# Patient Record
Sex: Male | Born: 1985 | Race: Asian | Hispanic: No | Marital: Single | Smoking: Current every day smoker
Health system: Southern US, Community
[De-identification: ages and names within clinical notes are randomized; demographics above are authoritative.]

## PROBLEM LIST (undated history)

## (undated) DIAGNOSIS — J302 Other seasonal allergic rhinitis: Secondary | ICD-10-CM

---
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
## 2004-03-26 ENCOUNTER — Emergency Department (HOSPITAL_COMMUNITY): Admission: EM | Admit: 2004-03-26 | Discharge: 2004-03-26 | Payer: Self-pay | Admitting: Emergency Medicine

## 2005-01-24 ENCOUNTER — Emergency Department (HOSPITAL_COMMUNITY): Admission: EM | Admit: 2005-01-24 | Discharge: 2005-01-24 | Payer: Self-pay | Admitting: Emergency Medicine

## 2007-02-15 ENCOUNTER — Emergency Department (HOSPITAL_COMMUNITY): Admission: EM | Admit: 2007-02-15 | Discharge: 2007-02-15 | Payer: Self-pay | Admitting: Emergency Medicine

## 2008-04-15 ENCOUNTER — Emergency Department (HOSPITAL_COMMUNITY): Admission: EM | Admit: 2008-04-15 | Discharge: 2008-04-15 | Payer: Self-pay | Admitting: Emergency Medicine

## 2008-09-08 ENCOUNTER — Emergency Department (HOSPITAL_COMMUNITY): Admission: EM | Admit: 2008-09-08 | Discharge: 2008-09-08 | Payer: Self-pay | Admitting: Emergency Medicine

## 2009-11-30 ENCOUNTER — Emergency Department (HOSPITAL_COMMUNITY): Admission: EM | Admit: 2009-11-30 | Discharge: 2009-11-30 | Payer: Self-pay | Admitting: Emergency Medicine

## 2010-07-24 LAB — URINALYSIS, ROUTINE W REFLEX MICROSCOPIC
Hgb urine dipstick: NEGATIVE
Protein, ur: 30 mg/dL — AB
Urobilinogen, UA: 0.2 mg/dL (ref 0.0–1.0)

## 2010-07-24 LAB — CBC
Hemoglobin: 14.4 g/dL (ref 13.0–17.0)
MCHC: 32.1 g/dL (ref 30.0–36.0)
RDW: 15.4 % (ref 11.5–15.5)

## 2010-07-24 LAB — URINE CULTURE: Culture: NO GROWTH

## 2010-07-24 LAB — URINE MICROSCOPIC-ADD ON

## 2010-07-24 LAB — POCT I-STAT, CHEM 8
Chloride: 110 mEq/L (ref 96–112)
HCT: 48 % (ref 39.0–52.0)
Potassium: 3.5 mEq/L (ref 3.5–5.1)
Sodium: 142 mEq/L (ref 135–145)

## 2010-07-24 LAB — DIFFERENTIAL
Basophils Absolute: 0 10*3/uL (ref 0.0–0.1)
Basophils Relative: 0 % (ref 0–1)
Neutro Abs: 8.6 10*3/uL — ABNORMAL HIGH (ref 1.7–7.7)
Neutrophils Relative %: 79 % — ABNORMAL HIGH (ref 43–77)

## 2011-02-11 LAB — URINE MICROSCOPIC-ADD ON

## 2011-02-11 LAB — URINALYSIS, ROUTINE W REFLEX MICROSCOPIC
Nitrite: NEGATIVE
Specific Gravity, Urine: 1.027 (ref 1.005–1.030)
pH: 6 (ref 5.0–8.0)

## 2011-02-17 LAB — URINE CULTURE: Colony Count: NO GROWTH

## 2011-02-17 LAB — URINE MICROSCOPIC-ADD ON

## 2011-02-17 LAB — URINALYSIS, ROUTINE W REFLEX MICROSCOPIC
Bilirubin Urine: NEGATIVE
Nitrite: NEGATIVE
Specific Gravity, Urine: 1.023
Urobilinogen, UA: 0.2

## 2011-07-27 ENCOUNTER — Encounter (HOSPITAL_COMMUNITY): Payer: Self-pay | Admitting: Emergency Medicine

## 2011-07-27 ENCOUNTER — Emergency Department (HOSPITAL_COMMUNITY)
Admission: EM | Admit: 2011-07-27 | Discharge: 2011-07-27 | Disposition: A | Discharge status: Home / Self Care | Payer: Self-pay | Attending: Emergency Medicine | Admitting: Emergency Medicine

## 2011-07-27 DIAGNOSIS — F17200 Nicotine dependence, unspecified, uncomplicated: Secondary | ICD-10-CM | POA: Insufficient documentation

## 2011-07-27 DIAGNOSIS — W57XXXA Bitten or stung by nonvenomous insect and other nonvenomous arthropods, initial encounter: Secondary | ICD-10-CM | POA: Insufficient documentation

## 2011-07-27 DIAGNOSIS — Y9229 Other specified public building as the place of occurrence of the external cause: Secondary | ICD-10-CM | POA: Insufficient documentation

## 2011-07-27 DIAGNOSIS — T148 Other injury of unspecified body region: Secondary | ICD-10-CM | POA: Insufficient documentation

## 2011-07-27 NOTE — Discharge Instructions (Signed)
Bedbugs  Take Benadryl as needed for itching Bedbugs are tiny bugs that live in and around beds. They come out at night and bite people lying in bed. Bedbug bites rarely cause a medical problem. The bites do cause red, itchy bumps. HOME CARE  Only take medicine as told by your doctor.   Wear pajamas with long sleeves and pant legs.   Call a pest control expert. You may need to throw away your mattress. Ask the pest control expert what you can do to keep the bedbugs from coming back. You may need to:   Put a plastic cover over your mattress.   Wash your clothes and bedding in hot water. Dry them in a hot dryer. The temperature should be hotter than 120 F (48.9 C).   Vacuum all around your bed often.   Check all used furniture, bedding, or clothes for bedbugs before you bring them into your house.   Remove bird nests and bat perches around your home.   After you travel, check your clothes and luggage for bedbugs before you bring them into your house. If you find any bedbugs, throw those items away.  GET HELP RIGHT AWAY IF:  You have a fever.   You have red bug bites that keep coming back.   You have red bug bites that itch badly.   You have bug bites that cause a skin rash.   You have scratch marks that are red and sore.  MAKE SURE YOU:  Understand these instructions.   Will watch your condition.   Will get help right away if you are not doing well or get worse.  Document Released: 08/10/2010 Document Revised: 04/14/2011 Document Reviewed: 08/10/2010 [facility] Patient Information 2012 [place], [place].

## 2011-07-27 NOTE — ED Notes (Signed)
Complaining of insect bites. States he was working out of town. Several bites located on torso

## 2011-07-27 NOTE — ED Provider Notes (Signed)
History     CSN: [id number]  Arrival date & time 07/27/11  1905   First MD Initiated Contact with Patient 07/27/11 2226      Chief Complaint  Patient presents with  . Insect Bite    (Consider location/radiation/quality/duration/timing/severity/associated sxs/prior treatment) HPI Comments: Patient reports that over the past 3 days he has been staying in a motel.  This morning when he woke up he noticed several insect bites on his chest, abdomen, and back.  He sleeps without a shirt on.  He did not feel an insect bite him or see any insects.  He reports that the bites do itch, but are not painful.  He has not had any other associated symptoms.  He has not taken any medication for the symptoms.  The history is provided by the patient.    History reviewed. No pertinent past medical history.  History reviewed. No pertinent past surgical history.  No family history on file.  History  Substance Use Topics  . Smoking status: Current Everyday Smoker  . Smokeless tobacco: Not on file  . Alcohol Use: Yes      Review of Systems  Constitutional: Negative for fever and chills.  Respiratory: Negative for shortness of breath.   Gastrointestinal: Negative for nausea and vomiting.  Musculoskeletal: Negative for joint swelling.  Skin: Positive for rash.    Allergies  Codeine  Home Medications   Current Outpatient Rx  Name Route Sig Dispense Refill  . IBUPROFEN 200 MG PO TABS Oral Take 400 mg by mouth every 6 (six) hours as needed. For pain      BP 120/64  Pulse 105  Temp(Src) 98.1 F (36.7 C) (Oral)  Resp 18  SpO2 100%  Physical Exam  Nursing note and vitals reviewed. Constitutional: He appears well-developed and well-nourished. No distress.  HENT:  Head: Normocephalic and atraumatic.  Neck: Normal range of motion. Neck supple.  Cardiovascular: Normal rate, regular rhythm and normal heart sounds.   Pulmonary/Chest: Effort normal and breath sounds normal. No respiratory  distress. He has no wheezes.  Neurological: He is alert.  Skin: No petechiae and no purpura noted. Rash is not urticarial. He is not diaphoretic.       Several erythematous welts on the abdomen, chest, and back.  Areas pruiritic.    Psychiatric: He has a normal mood and affect.    ED Course  Procedures (including critical care time)  Labs Reviewed - No data to display No results found.   1. Bed bug bite       MDM  History and appearance of rash consistent with bed bug bites.  Patient instructed to take Benadryl for itching.  Return precautions discussed with patient.        Pascal Lux [place], PA-C 07/28/11 [phone]

## 2011-07-27 NOTE — ED Notes (Signed)
PT. REPORTS 3 INSECT BITES AT LEFT ABDOMEN AND NECK , ITCHY WITH REDDNESS, NO DRAINAGE.

## 2011-07-30 NOTE — ED Provider Notes (Signed)
Medical screening examination/treatment/procedure(s) were performed by non-physician practitioner and as supervising physician I was immediately available for consultation/collaboration. Devoria Albe, MD, Armando Gang   Ward Givens, MD 07/30/11 2352

## 2012-11-14 ENCOUNTER — Emergency Department (HOSPITAL_COMMUNITY)
Admission: EM | Admit: 2012-11-14 | Discharge: 2012-11-14 | Disposition: A | Discharge status: Home / Self Care | Payer: Self-pay | Attending: Emergency Medicine | Admitting: Emergency Medicine

## 2012-11-14 ENCOUNTER — Encounter (HOSPITAL_COMMUNITY): Payer: Self-pay

## 2012-11-14 DIAGNOSIS — R221 Localized swelling, mass and lump, neck: Secondary | ICD-10-CM | POA: Insufficient documentation

## 2012-11-14 DIAGNOSIS — L259 Unspecified contact dermatitis, unspecified cause: Secondary | ICD-10-CM | POA: Insufficient documentation

## 2012-11-14 DIAGNOSIS — F17200 Nicotine dependence, unspecified, uncomplicated: Secondary | ICD-10-CM | POA: Insufficient documentation

## 2012-11-14 DIAGNOSIS — R220 Localized swelling, mass and lump, head: Secondary | ICD-10-CM | POA: Insufficient documentation

## 2012-11-14 MED ORDER — METHYLPREDNISOLONE SODIUM SUCC 125 MG IJ SOLR
125.0000 mg | Freq: Once | INTRAMUSCULAR | Status: DC
  Filled 2012-11-14: qty 2

## 2012-11-14 MED ORDER — METHYLPREDNISOLONE SODIUM SUCC 125 MG IJ SOLR
125.0000 mg | Freq: Once | INTRAMUSCULAR | Status: AC
  Administered 2012-11-14: 125 mg via INTRAMUSCULAR

## 2012-11-14 MED ORDER — HYDROXYZINE HCL 25 MG PO TABS: 25.0000 mg | ORAL_TABLET | Freq: Four times a day (QID) | ORAL | Status: DC
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
## 2012-11-14 NOTE — ED Notes (Signed)
No answer in lobby.
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
## 2012-11-17 NOTE — ED Provider Notes (Signed)
Medical screening examination/treatment/procedure(s) were performed by non-physician practitioner and as supervising physician I was immediately available for consultation/collaboration.   Kynzleigh Bandel W Deforest Maiden, MD 11/17/12 1527 

## 2012-11-20 ENCOUNTER — Encounter (HOSPITAL_COMMUNITY): Payer: Self-pay | Admitting: Emergency Medicine

## 2012-11-20 ENCOUNTER — Emergency Department (HOSPITAL_COMMUNITY)
Admission: EM | Admit: 2012-11-20 | Discharge: 2012-11-21 | Disposition: A | Discharge status: Home / Self Care | Payer: Self-pay | Attending: Emergency Medicine | Admitting: Emergency Medicine

## 2012-11-20 DIAGNOSIS — F17200 Nicotine dependence, unspecified, uncomplicated: Secondary | ICD-10-CM | POA: Insufficient documentation

## 2012-11-20 DIAGNOSIS — IMO0002 Reserved for concepts with insufficient information to code with codable children: Secondary | ICD-10-CM | POA: Insufficient documentation

## 2012-11-20 DIAGNOSIS — F39 Unspecified mood [affective] disorder: Secondary | ICD-10-CM | POA: Insufficient documentation

## 2012-11-20 DIAGNOSIS — F4323 Adjustment disorder with mixed anxiety and depressed mood: Secondary | ICD-10-CM | POA: Insufficient documentation

## 2012-11-20 DIAGNOSIS — R45851 Suicidal ideations: Secondary | ICD-10-CM | POA: Insufficient documentation

## 2012-11-20 DIAGNOSIS — F4320 Adjustment disorder, unspecified: Secondary | ICD-10-CM

## 2012-11-20 LAB — RAPID URINE DRUG SCREEN, HOSP PERFORMED
Amphetamines: NOT DETECTED
Barbiturates: NOT DETECTED
Tetrahydrocannabinol: NOT DETECTED

## 2012-11-20 LAB — CBC
HCT: 45.6 % (ref 39.0–52.0)
Platelets: 247 10*3/uL (ref 150–400)
RBC: 6.47 MIL/uL — ABNORMAL HIGH (ref 4.22–5.81)
RDW: 15.4 % (ref 11.5–15.5)
WBC: 11.2 10*3/uL — ABNORMAL HIGH (ref 4.0–10.5)

## 2012-11-20 LAB — COMPREHENSIVE METABOLIC PANEL
ALT: 31 U/L (ref 0–53)
AST: 16 U/L (ref 0–37)
Albumin: 3.8 g/dL (ref 3.5–5.2)
Alkaline Phosphatase: 91 U/L (ref 39–117)
Chloride: 106 mEq/L (ref 96–112)
Potassium: 4.1 mEq/L (ref 3.5–5.1)
Total Bilirubin: 0.2 mg/dL — ABNORMAL LOW (ref 0.3–1.2)

## 2012-11-20 MED ORDER — ZIPRASIDONE MESYLATE 20 MG IM SOLR
20.0000 mg | Freq: Once | INTRAMUSCULAR | Status: AC | PRN
  Filled 2012-11-20: qty 20

## 2012-11-20 NOTE — ED Provider Notes (Signed)
History    CSN: [id number] Arrival date & time 11/20/12  2115  First MD Initiated Contact with Patient 11/20/12 2123     Chief Complaint  Patient presents with  . Medical Clearance   HPI  History provided by the patient and [employer]. The patient is a 27 year old male with no significant PMH who presents in custody of police for expressed S. and HI earlier. Rations states that he was in an argument with his girlfriend and cannot take anymore. He was very upset and made verbal threats of killing her as well as stating that you plan to drive off a bridge. He called the police himself and stated he was going to do something bad. Please came in he was placed in custody. They are currently in the process of taking out IVC paperwork. Patient does admit to previous history of depression and cutting himself 6 months ago. He is not currently on any medications. He denies any other symptoms. No hallucinations. No drug or alcohol use.    History reviewed. No pertinent past medical history. History reviewed. No pertinent past surgical history. History reviewed. No pertinent family history. History  Substance Use Topics  . Smoking status: Current Every Day Smoker  . Smokeless tobacco: Not on file  . Alcohol Use: Yes    Review of Systems  Psychiatric/Behavioral: Positive for suicidal ideas, dysphoric mood and agitation. Negative for hallucinations and self-injury.  All other systems reviewed and are negative.    Allergies  Codeine  Home Medications   Current Outpatient Rx  Name  Route  Sig  Dispense  Refill  . diphenhydrAMINE (SOMINEX) 25 MG tablet   Oral   Take 25 mg by mouth at bedtime as needed for itching or sleep.         . hydrOXYzine (ATARAX/VISTARIL) 25 MG tablet   Oral   Take 1 tablet (25 mg total) by mouth every 6 (six) hours.   15 tablet   0    BP 139/79  Pulse 106  Temp(Src) 97.5 F (36.4 C) (Oral)  Resp 20  SpO2 98% Physical Exam  Nursing note and vitals  reviewed. Constitutional: He is oriented to person, place, and time. He appears well-developed and well-nourished. No distress.  HENT:  Head: Normocephalic.  Cardiovascular: Normal rate and regular rhythm.   No murmur heard. Pulmonary/Chest: Effort normal and breath sounds normal. No respiratory distress. He has no wheezes. He has no rales.  Abdominal: Soft.  Musculoskeletal: Normal range of motion.  Neurological: He is alert and oriented to person, place, and time.  Skin: Skin is warm.  Psychiatric: He has a normal mood and affect. His behavior is normal. He expresses no homicidal and no suicidal ideation.    ED Course  Procedures   Results for orders placed during the hospital encounter of 11/30/09  URINE CULTURE      Result Value Range   Specimen Description URINE, RANDOM     Special Requests CX ADDED ON 7.25.11 @ 1338     Colony Count NO GROWTH     Culture NO GROWTH     Report Status 12/01/2009 FINAL    URINALYSIS, ROUTINE W REFLEX MICROSCOPIC      Result Value Range   Color, Urine YELLOW  YELLOW   APPearance HAZY (*) CLEAR   Specific Gravity, Urine 1.037 (*) 1.005 - 1.030   pH 5.5  5.0 - 8.0   Glucose, UA NEGATIVE  NEGATIVE mg/dL   Hgb urine dipstick NEGATIVE  NEGATIVE  Bilirubin Urine NEGATIVE  NEGATIVE   Ketones, ur NEGATIVE  NEGATIVE mg/dL   Protein, ur 30 (*) NEGATIVE mg/dL   Urobilinogen, UA 0.2  0.0 - 1.0 mg/dL   Nitrite NEGATIVE  NEGATIVE   Leukocytes, UA NEGATIVE  NEGATIVE  URINE MICROSCOPIC-ADD ON      Result Value Range   Squamous Epithelial / LPF RARE  RARE   WBC, UA 0-2  <3 WBC/hpf   Bacteria, UA FEW (*) RARE   Urine-Other       Value: MUCOUS PRESENT     AMORPHOUS URATES  CBC      Result Value Range   WBC 10.9 (*) 4.0 - 10.5 K/uL   RBC 6.31 (*) 4.22 - 5.81 MIL/uL   Hemoglobin 14.4  13.0 - 17.0 g/dL   HCT 45.4  09.8 - 11.9 %   MCV 71.0 (*) 78.0 - 100.0 fL   MCH 22.8 (*) 26.0 - 34.0 pg   MCHC 32.1  30.0 - 36.0 g/dL   RDW 14.7  82.9 - 56.2 %    Platelets 247  150 - 400 K/uL  DIFFERENTIAL      Result Value Range   Neutrophils Relative % 79 (*) 43 - 77 %   Neutro Abs 8.6 (*) 1.7 - 7.7 K/uL   Lymphocytes Relative 15  12 - 46 %   Lymphs Abs 1.6  0.7 - 4.0 K/uL   Monocytes Relative 4  3 - 12 %   Monocytes Absolute 0.4  0.1 - 1.0 K/uL   Eosinophils Relative 1  0 - 5 %   Eosinophils Absolute 0.1  0.0 - 0.7 K/uL   Basophils Relative 0  0 - 1 %   Basophils Absolute 0.0  0.0 - 0.1 K/uL  POCT I-STAT, CHEM 8      Result Value Range   Sodium 142  135 - 145 mEq/L   Potassium 3.5  3.5 - 5.1 mEq/L   Chloride 110  96 - 112 mEq/L   BUN 14  6 - 23 mg/dL   Creatinine, Ser 0.9  0.4 - 1.5 mg/dL   Glucose, Bld 130 (*) 70 - 99 mg/dL   Calcium, Ion 8.65 (*) 1.12 - 1.32 mmol/L   TCO2 23  0 - 100 mmol/L   Hemoglobin 16.3  13.0 - 17.0 g/dL   HCT 78.4  69.6 - 29.5 %      1. Adjustment disorder       MDM  9:30PM patient seen and evaluated. Patient appears well in no acute distress. He is calm and cooperative at this time. He does admit to expressing SI and HI because of anger with his girlfriend earlier prior to arrival. He has history of depression in the past reports cutting himself 6 months ago. Currently not on any medications  Psych holding orders in place.  Angus Seller, PA-C 11/21/12 [phone]

## 2012-11-20 NOTE — ED Notes (Signed)
Patient expressed Suicidal Ideation and was brought in by [employer]. The patient is going to be IVC'd - The patient is calm and cooperative at the current time
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
## 2012-11-20 NOTE — ED Notes (Signed)
Writer gave pt a ham sandwich and a ginger ale.

## 2012-11-20 NOTE — ED Notes (Signed)
Pt has shoes, boxers, socks, jeans, and t-shirt,

## 2012-11-20 NOTE — ED Notes (Signed)
Seen and wand by security. 
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
## 2012-11-21 NOTE — ED Notes (Signed)
Telepsych consult in process at this time.

## 2012-11-21 NOTE — ED Provider Notes (Signed)
1:03 AM The patient was seen and evaluated by the psychiatrist Dr. Jacky Kindle, MD. who recommends discharge home with outpatient community mental health resources.  A copy the resource guide will be given.  He does not believe the patient to be a threat to himself or other at this time.  The patient is being taken into police custody at this time  Lyanne Co, MD 11/21/12 [phone]

## 2012-12-06 ENCOUNTER — Encounter (HOSPITAL_COMMUNITY): Payer: Self-pay | Admitting: Emergency Medicine

## 2012-12-06 ENCOUNTER — Emergency Department (HOSPITAL_COMMUNITY)
Admission: EM | Admit: 2012-12-06 | Discharge: 2012-12-06 | Disposition: A | Discharge status: Home / Self Care | Payer: Self-pay | Attending: Emergency Medicine | Admitting: Emergency Medicine

## 2012-12-06 ENCOUNTER — Emergency Department (HOSPITAL_COMMUNITY)
Admission: EM | Admit: 2012-12-06 | Discharge: 2012-12-06 | Discharge status: Against Medical Advice | Payer: Self-pay | Attending: Emergency Medicine | Admitting: Emergency Medicine

## 2012-12-06 DIAGNOSIS — Z029 Encounter for administrative examinations, unspecified: Secondary | ICD-10-CM | POA: Insufficient documentation

## 2012-12-06 DIAGNOSIS — Z8709 Personal history of other diseases of the respiratory system: Secondary | ICD-10-CM | POA: Insufficient documentation

## 2012-12-06 DIAGNOSIS — Z79899 Other long term (current) drug therapy: Secondary | ICD-10-CM | POA: Insufficient documentation

## 2012-12-06 DIAGNOSIS — E860 Dehydration: Secondary | ICD-10-CM | POA: Insufficient documentation

## 2012-12-06 DIAGNOSIS — R42 Dizziness and giddiness: Secondary | ICD-10-CM | POA: Insufficient documentation

## 2012-12-06 DIAGNOSIS — F17200 Nicotine dependence, unspecified, uncomplicated: Secondary | ICD-10-CM | POA: Insufficient documentation

## 2012-12-06 DIAGNOSIS — R55 Syncope and collapse: Secondary | ICD-10-CM | POA: Insufficient documentation

## 2012-12-06 HISTORY — DX: Other seasonal allergic rhinitis: J30.2

## 2012-12-06 LAB — CBC WITH DIFFERENTIAL/PLATELET
Basophils Absolute: 0.1 10*3/uL (ref 0.0–0.1)
Lymphocytes Relative: 32 % (ref 12–46)
Monocytes Relative: 9 % (ref 3–12)
Neutrophils Relative %: 54 % (ref 43–77)
Platelets: 232 10*3/uL (ref 150–400)
RDW: 15 % (ref 11.5–15.5)
WBC: 9.6 10*3/uL (ref 4.0–10.5)

## 2012-12-06 LAB — URINALYSIS, ROUTINE W REFLEX MICROSCOPIC
Glucose, UA: NEGATIVE mg/dL
Hgb urine dipstick: NEGATIVE
Protein, ur: NEGATIVE mg/dL
pH: 6 (ref 5.0–8.0)

## 2012-12-06 LAB — ETHANOL: Alcohol, Ethyl (B): <11 mg/dL (ref 0–11)

## 2012-12-06 LAB — COMPREHENSIVE METABOLIC PANEL
AST: 16 U/L (ref 0–37)
Albumin: 3.7 g/dL (ref 3.5–5.2)
Alkaline Phosphatase: 78 U/L (ref 39–117)
Chloride: 106 mEq/L (ref 96–112)
Creatinine, Ser: 0.94 mg/dL (ref 0.50–1.35)
Potassium: 4 mEq/L (ref 3.5–5.1)
Total Bilirubin: 0.2 mg/dL — ABNORMAL LOW (ref 0.3–1.2)

## 2012-12-06 LAB — RAPID URINE DRUG SCREEN, HOSP PERFORMED
Amphetamines: NOT DETECTED
Benzodiazepines: NOT DETECTED
Cocaine: POSITIVE — AB

## 2012-12-06 MED ORDER — SODIUM CHLORIDE 0.9 % IV BOLUS (SEPSIS)
1000.0000 mL | Freq: Once | INTRAVENOUS | Status: AC
  Administered 2012-12-06: 1000 mL via INTRAVENOUS

## 2012-12-06 NOTE — Progress Notes (Signed)
[facility] provided patient with a list of primary care resources. Patient stated that he was pending insurance through his job.

## 2012-12-06 NOTE — ED Notes (Signed)
States that he got up this am and passed out for about 5-6 seconds while he was at home. States that he feel on his bed and did not hit his head.  States that he has not been dizzy states that he was told to come to the ER by his boss to get checked out

## 2012-12-06 NOTE — ED Notes (Signed)
Pt did not answer x 3; pt seen ambulating out of ED just after checking it; pt sts needed to be checking on car but never returned

## 2012-12-06 NOTE — ED Notes (Signed)
Pt did not answer x 2 

## 2012-12-06 NOTE — ED Notes (Signed)
Pt did not answer x 1 

## 2012-12-06 NOTE — ED Provider Notes (Signed)
CSN: [id number]     Arrival date & time 12/06/12  1435 History     First MD Initiated Contact with Patient 12/06/12 1507     Chief Complaint  Patient presents with  . Loss of Consciousness   (Consider location/radiation/quality/duration/timing/severity/associated sxs/prior Treatment) HPI Pt states he got up quickly this AM out of bed at 0630 and became lightheaded and "passed out" back onto bed for 45 sec. Unclear whether pt with full LOC. States he has felt fine since. No previous history of similar symptoms. 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 No trauma from fall. Denies head or neck injury. No CP, SOB or lower ext swelling or pain. No fever, chills. Pt states he takes Zyrtec for allergies and took one yesterday.  Past Medical History  Diagnosis Date  . Seasonal allergies    History reviewed. No pertinent past surgical history. No family history on file. History  Substance Use Topics  . Smoking status: Current Every Day Smoker  . Smokeless tobacco: Not on file  . Alcohol Use: Yes    Review of Systems  Constitutional: Negative for fever and chills.  HENT: Negative for neck pain and neck stiffness.   Eyes: Negative for visual disturbance.  Respiratory: Negative for cough, shortness of breath and wheezing.   Cardiovascular: Negative for chest pain, palpitations and leg swelling.  Gastrointestinal: Negative for nausea, vomiting, abdominal pain and diarrhea.  Genitourinary: Negative for dysuria.  Musculoskeletal: Negative for back pain.  Skin: Negative for rash and wound.  Neurological: Positive for dizziness, syncope (possible) and light-headedness. Negative for weakness, numbness and headaches.  All other systems reviewed and are negative.    Allergies  Codeine  Home Medications   Current Outpatient Rx  Name  Route  Sig  Dispense  Refill  . cetirizine (ZYRTEC) 10 MG tablet   Oral   Take 10 mg by mouth daily.         Marland Kitchen  tetrahydrozoline 0.05 % ophthalmic solution   Both Eyes   Place 1 drop into both eyes 2 (two) times daily as needed (irritated eyes).          BP 102/76  Pulse 97 Physical Exam  Nursing note and vitals reviewed. Constitutional: He is oriented to person, place, and time. He appears well-developed and well-nourished. No distress.  HENT:  Head: Normocephalic and atraumatic.  Mouth/Throat: Oropharynx is clear and moist.  Eyes: EOM are normal. Pupils are equal, round, and reactive to light.  Neck: Normal range of motion. Neck supple.  No nuchal rigidity, no midline tenderness  Cardiovascular: Normal rate and regular rhythm.   Pulmonary/Chest: Effort normal and breath sounds normal. No respiratory distress. He has no wheezes. He has no rales. He exhibits no tenderness.  Abdominal: Soft. Bowel sounds are normal. He exhibits no distension and no mass. There is no tenderness. There is no rebound and no guarding.  Musculoskeletal: Normal range of motion. He exhibits no edema and no tenderness.  No calf swelling or pain  Neurological: He is alert and oriented to person, place, and time.  5/5 motor in all ext, sensation intact  Skin: Skin is warm and dry. No rash noted. No erythema.  Psychiatric: He has a normal mood and affect. His behavior is normal.    ED Course   Procedures (including critical care time)  Labs Reviewed  CBC WITH DIFFERENTIAL - Abnormal; Notable for the following:    RBC 6.26 (*)    MCV 70.9 (*)  MCH 22.8 (*)    All other components within normal limits  COMPREHENSIVE METABOLIC PANEL - Abnormal; Notable for the following:    Total Bilirubin 0.2 (*)    All other components within normal limits  URINALYSIS, ROUTINE W REFLEX MICROSCOPIC - Abnormal; Notable for the following:    Specific Gravity, Urine 1.031 (*)    All other components within normal limits  URINE RAPID DRUG SCREEN (HOSP PERFORMED) - Abnormal; Notable for the following:    Cocaine POSITIVE (*)     All other components within normal limits  ETHANOL   No results found. 1. Syncope   2. Dehydration     Date: 12/06/2012  Rate: 105  Rhythm: sinus tachycardia  QRS Axis: normal  Intervals: normal  ST/T Wave abnormalities: normal  Conduction Disutrbances:none  Narrative Interpretation:   Old EKG Reviewed: none available   MDM  Pt with improvement in HR with IV fluids. Tachy likely due to dehydration and cocaine abuse. D/c home. Encouraged hydration and avoidance of cocaine.   Loren Racer, MD 12/06/12 [phone]

## 2013-01-22 ENCOUNTER — Emergency Department (HOSPITAL_COMMUNITY)
Admission: EM | Admit: 2013-01-22 | Discharge: 2013-01-22 | Disposition: A | Discharge status: Home / Self Care | Payer: Self-pay | Attending: Emergency Medicine | Admitting: Emergency Medicine

## 2013-01-22 ENCOUNTER — Encounter (HOSPITAL_COMMUNITY): Payer: Self-pay | Admitting: Emergency Medicine

## 2013-01-22 DIAGNOSIS — Y929 Unspecified place or not applicable: Secondary | ICD-10-CM | POA: Insufficient documentation

## 2013-01-22 DIAGNOSIS — M79671 Pain in right foot: Secondary | ICD-10-CM

## 2013-01-22 DIAGNOSIS — Y9367 Activity, basketball: Secondary | ICD-10-CM | POA: Insufficient documentation

## 2013-01-22 DIAGNOSIS — F17200 Nicotine dependence, unspecified, uncomplicated: Secondary | ICD-10-CM | POA: Insufficient documentation

## 2013-01-22 DIAGNOSIS — Z79899 Other long term (current) drug therapy: Secondary | ICD-10-CM | POA: Insufficient documentation

## 2013-01-22 DIAGNOSIS — Z8709 Personal history of other diseases of the respiratory system: Secondary | ICD-10-CM | POA: Insufficient documentation

## 2013-01-22 DIAGNOSIS — X500XXA Overexertion from strenuous movement or load, initial encounter: Secondary | ICD-10-CM | POA: Insufficient documentation

## 2013-01-22 DIAGNOSIS — S8990XA Unspecified injury of unspecified lower leg, initial encounter: Secondary | ICD-10-CM | POA: Insufficient documentation

## 2013-01-22 NOTE — ED Notes (Signed)
Pt c/o pain in r/foot x 3 days. Gaylyn Rong been tx with ice over  last few days. Does not want xray at this time

## 2013-01-22 NOTE — Progress Notes (Signed)
[facility] provided pt with a list of primary care resources. Patient stated that he was pending insurance through his job.  °

## 2013-01-22 NOTE — ED Provider Notes (Signed)
CSN: [id number]     Arrival date & time 01/22/13  1515 History  This chart was scribed for non-physician practitioner, Magnus Sinning, PA-C working with Junius Argyle, MD by Greggory Stallion, ED scribe. This patient was seen in room WTR8/WTR8 and the patient's care was started at 4:29 PM.   Chief Complaint  Patient presents with  . Foot Pain    pt c/o foot pain x 3 days   The history is provided by the patient. No language interpreter was used.    HPI Comments: Bobby Barrett is a 27 y.o. male who presents to the Emergency Department complaining of right foot pain that started 3 days ago when he twisted it while playing basketball. Pt states it was swollen 2 days ago but it has resolved. He has used ice and ibuprofen with mild relief. Bearing weight worsens the pain. However, he has been ambulatory since the injury.  Pt states he does not want an xray and only wants a note that states he can return to work.   Past Medical History  Diagnosis Date  . Seasonal allergies    No past surgical history on file. No family history on file. History  Substance Use Topics  . Smoking status: Current Every Day Smoker  . Smokeless tobacco: Not on file  . Alcohol Use: Yes    Review of Systems  Musculoskeletal: Positive for arthralgias. Negative for joint swelling.  All other systems reviewed and are negative.    Allergies  Codeine  Home Medications   Current Outpatient Rx  Name  Route  Sig  Dispense  Refill  . cetirizine (ZYRTEC) 10 MG tablet   Oral   Take 10 mg by mouth daily.         Marland Kitchen tetrahydrozoline 0.05 % ophthalmic solution   Both Eyes   Place 1 drop into both eyes 2 (two) times daily as needed (irritated eyes).          BP 101/65  Pulse 79  Temp(Src) 98.3 F (36.8 C) (Oral)  Resp 18  Physical Exam  Nursing note and vitals reviewed. Constitutional: He appears well-developed and well-nourished.  HENT:  Head: Normocephalic and atraumatic.  Mouth/Throat:  Oropharynx is clear and moist.  Eyes: EOM are normal. Pupils are equal, round, and reactive to light.  Neck: Normal range of motion. Neck supple.  Cardiovascular: Normal rate, regular rhythm and normal heart sounds.   2+ dorsal pedis pulse on right foot.   Pulmonary/Chest: Effort normal and breath sounds normal. No respiratory distress. He has no wheezes. He has no rales.  Musculoskeletal: Normal range of motion.  No tenderness to palpation on lateral and medial malleolus. No bony tenderness to palpation on foot.   Neurological: He is alert.  Distal sensation of all toes of right foot intact.   Skin: Skin is warm and dry.  Small bruise to dorsal aspect of right foot.   Psychiatric: He has a normal mood and affect. His behavior is normal.    ED Course  Procedures (including critical care time)  COORDINATION OF CARE: 4:34 PM-Discussed treatment plan which includes continuing ice and ibuprofen with pt at bedside and pt agreed to plan. Will give pt a work note to continue working.   Labs Review Labs Reviewed - No data to display Imaging Review No results found.  MDM  No diagnosis found. Patient presenting with right foot pain.  He did not want xray.  He has been ambulatory.  No bony tenderness to  palpation.  Patient neurovascularly intact.  Feel that the patient is stable for discharge.  I personally performed the services described in this documentation, which was scribed in my presence. The recorded information has been reviewed and is accurate.   Pascal Lux [place], PA-C 01/22/13 1701

## 2013-01-23 ENCOUNTER — Emergency Department (HOSPITAL_COMMUNITY)
Admission: EM | Admit: 2013-01-23 | Discharge: 2013-01-23 | Disposition: A | Discharge status: Home / Self Care | Payer: Self-pay | Attending: Emergency Medicine | Admitting: Emergency Medicine

## 2013-01-23 ENCOUNTER — Encounter (HOSPITAL_COMMUNITY): Payer: Self-pay | Admitting: Emergency Medicine

## 2013-01-23 DIAGNOSIS — G8911 Acute pain due to trauma: Secondary | ICD-10-CM | POA: Insufficient documentation

## 2013-01-23 DIAGNOSIS — F17200 Nicotine dependence, unspecified, uncomplicated: Secondary | ICD-10-CM | POA: Insufficient documentation

## 2013-01-23 DIAGNOSIS — M79609 Pain in unspecified limb: Secondary | ICD-10-CM | POA: Insufficient documentation

## 2013-01-23 DIAGNOSIS — M79671 Pain in right foot: Secondary | ICD-10-CM

## 2013-01-23 DIAGNOSIS — Z79899 Other long term (current) drug therapy: Secondary | ICD-10-CM | POA: Insufficient documentation

## 2013-01-23 NOTE — ED Provider Notes (Signed)
CSN: [id number]     Arrival date & time 01/23/13  1117 History   First MD Initiated Contact with Patient 01/23/13 1126     Chief Complaint  Patient presents with  . 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 Patient was seen here yesterday for a right foot injury that occurred 5 days ago while playing basketball. Patient reports persistent throbbing pain that does not radiate. Patient was planning on returning to work today but was having too much pain this morning. Patient is feeling better now and would like to go back to work tomorrow. Patient denies new injury.    Past Medical History  Diagnosis Date  . Seasonal allergies    History reviewed. No pertinent past surgical history. History reviewed. No pertinent family history. History  Substance Use Topics  . Smoking status: Current Every Day Smoker  . Smokeless tobacco: Not on file  . Alcohol Use: Yes    Review of Systems  Musculoskeletal: Positive for arthralgias.  All other systems reviewed and are negative.    Allergies  Codeine  Home Medications   Current Outpatient Rx  Name  Route  Sig  Dispense  Refill  . cetirizine (ZYRTEC) 10 MG tablet   Oral   Take 10 mg by mouth daily.         Marland Kitchen ibuprofen (ADVIL,MOTRIN) 200 MG tablet   Oral   Take 400 mg by mouth every 6 (six) hours as needed for pain.          BP 124/74  Pulse 78  Temp(Src) 98.4 F (36.9 C) (Oral)  Resp 16  SpO2 100% Physical Exam  Nursing note and vitals reviewed. Constitutional: He is oriented to person, place, and time. He appears well-developed and well-nourished. No distress.  HENT:  Head: Normocephalic and atraumatic.  Eyes: Conjunctivae are normal.  Neck: Normal range of motion.  Cardiovascular: Normal rate, regular rhythm and intact distal pulses.  Exam reveals no  gallop and no friction rub.   No murmur heard. Pulmonary/Chest: Effort normal and breath sounds normal. He has no wheezes. He has no rales. He exhibits no tenderness.  Abdominal: Soft.  Musculoskeletal: Normal range of motion.  Mild tenderness of lateral right foot. No obvious deformity.   Neurological: He is alert and oriented to person, place, and time. Coordination normal.  Speech is goal-oriented. Moves limbs without ataxia.   Skin: Skin is warm and dry.  Psychiatric: He has a normal mood and affect. His behavior is normal.    ED Course  Procedures (including critical care time) Labs Review Labs Reviewed - No data to display Imaging Review No results found.  MDM   1. Right foot pain     11:27 AM Patient declines xray at this time and wants a noted saying that his time off from work can be extended an additional day due to persistent pain this morning. Patient now has no pain. No neurovascular compromise. No other injury. No further evaluation needed at this time.     Emilia Beck, PA-C 01/23/13 1144

## 2013-01-23 NOTE — ED Notes (Signed)
Pt c/o r foot pain. Denies pain at this time. Reports being seen here yesterday and is back today for a dr's note saying that he can return to work tomorrow instead of today.  Reports still having stiffness.

## 2013-01-23 NOTE — ED Provider Notes (Signed)
Medical screening examination/treatment/procedure(s) were performed by non-physician practitioner and as supervising physician I was immediately available for consultation/collaboration.   Tykeria Wawrzyniak, MD 01/23/13 1455 

## 2013-01-23 NOTE — ED Provider Notes (Signed)
Medical screening examination/treatment/procedure(s) were performed by non-physician practitioner and as supervising physician I was immediately available for consultation/collaboration.   Junius Argyle, MD 01/23/13 [phone]

## 2013-04-09 ENCOUNTER — Encounter (HOSPITAL_COMMUNITY): Payer: Self-pay | Admitting: Emergency Medicine

## 2013-04-09 ENCOUNTER — Emergency Department (HOSPITAL_COMMUNITY)
Admission: EM | Admit: 2013-04-09 | Discharge: 2013-04-09 | Disposition: A | Discharge status: Home / Self Care | Payer: Self-pay | Attending: Emergency Medicine | Admitting: Emergency Medicine

## 2013-04-09 DIAGNOSIS — F17200 Nicotine dependence, unspecified, uncomplicated: Secondary | ICD-10-CM | POA: Insufficient documentation

## 2013-04-09 DIAGNOSIS — R55 Syncope and collapse: Secondary | ICD-10-CM | POA: Insufficient documentation

## 2013-04-09 NOTE — ED Provider Notes (Signed)
CSN: [id number]     Arrival date & time 04/09/13  1948 History   First MD Initiated Contact with Patient 04/09/13 2053     Chief Complaint  Patient presents with  . Anxiety   (Consider location/radiation/quality/duration/timing/severity/associated sxs/prior Treatment) Patient is a 27 y.o. male presenting with syncope.  Loss of Consciousness Episode history:  Single Most recent episode:  Today Duration: A few seconds. Timing: Once. Progression:  Resolved Chronicity:  New Context comment:  Shortly after waking up Witnessed: yes   Relieved by:  Nothing Worsened by:  Nothing tried Associated symptoms: no chest pain, no diaphoresis, no difficulty breathing, no fever, no nausea, no shortness of breath and no vomiting   Associated symptoms comment:  Lightheaded   Past Medical History  Diagnosis Date  . Seasonal allergies    History reviewed. No pertinent past surgical history. No family history on file. History  Substance Use Topics  . Smoking status: Current Every Day Smoker  . Smokeless tobacco: Not on file  . Alcohol Use: Yes     Comment: once weekly    Review of Systems  Constitutional: Negative for fever and diaphoresis.  HENT: Negative for congestion.   Respiratory: Negative for cough and shortness of breath.   Cardiovascular: Positive for syncope. Negative for chest pain.  Gastrointestinal: Negative for nausea, vomiting, abdominal pain and diarrhea.  All other systems reviewed and are negative.    Allergies  Codeine  Home Medications  No current outpatient prescriptions on file. BP 108/71  Pulse 96  Temp(Src) 98.4 F (36.9 C) (Oral)  Resp 16  SpO2 94% Physical Exam  Nursing note and vitals reviewed. Constitutional: He is oriented to person, place, and time. He appears well-developed and well-nourished. No distress.  HENT:  Head: Normocephalic and atraumatic.  Mouth/Throat: Oropharynx is clear and moist.  Eyes: Conjunctivae are normal. Pupils are equal,  round, and reactive to light. No scleral icterus.  Neck: Neck supple.  Cardiovascular: Normal rate, regular rhythm, normal heart sounds and intact distal pulses.   No murmur heard. Pulmonary/Chest: Effort normal and breath sounds normal. No stridor. No respiratory distress. He has no wheezes. He has no rales.  Abdominal: Soft. He exhibits no distension. There is no tenderness.  Musculoskeletal: Normal range of motion. He exhibits no edema.  Neurological: He is alert and oriented to person, place, and time.  Skin: Skin is warm and dry. No rash noted.  Psychiatric: He has a normal mood and affect. His behavior is normal.    ED Course  Procedures (including critical care time) Labs Review Labs Reviewed - No data to display Imaging Review No results found.  EKG Interpretation    Date/Time:  Tuesday April 09 2013 21:02:47 EST Ventricular Rate:  86 PR Interval:  151 QRS Duration: 110 QT Interval:  342 QTC Calculation: 409 R Axis:   45 Text Interpretation:  Sinus rhythm RSR' in V1 or V2, right VCD or RVH ST elev, probable normal early repol pattern No significant change was found Confirmed by [facility]  MD, TREY ([id number]) on 04/09/2013 9:50:42 PM            MDM   1. Syncope    27 year old male who passed out this morning, shortly after awakening, which was greater than 12 hours ago. He reports feeling lightheaded for a brief period of time and then falling back onto the bed. He states that his wife said he passed out for a few seconds. He has felt fine throughout the day today  and denies any current symptoms including chest pain, shortness of breath, and lightheadedness. He thinks his symptoms might be secondary to increased anxiety and stress. I have a low suspicion for cardiogenic syncope. I his PCP follow up and gave return precautions.    Candyce Churn, MD 04/09/13 [phone]

## 2013-04-09 NOTE — ED Notes (Signed)
Patient resting and ready to eat something,

## 2013-04-09 NOTE — ED Notes (Signed)
Pt st's he had a syncopal episode this am and waking up.  Pt st's he has been under a lot of stress lately.  St's father passed away 6 months ago.  Also st's he had nausea and vomiting this pm.  Pt st's he feels a lot better at this time

## 2013-04-10 ENCOUNTER — Encounter (HOSPITAL_COMMUNITY): Payer: Self-pay | Admitting: Emergency Medicine

## 2013-04-10 ENCOUNTER — Emergency Department (HOSPITAL_COMMUNITY)
Admission: EM | Admit: 2013-04-10 | Discharge: 2013-04-10 | Disposition: A | Discharge status: Home / Self Care | Payer: Self-pay | Attending: Emergency Medicine | Admitting: Emergency Medicine

## 2013-04-10 DIAGNOSIS — F17200 Nicotine dependence, unspecified, uncomplicated: Secondary | ICD-10-CM | POA: Insufficient documentation

## 2013-04-10 DIAGNOSIS — G43909 Migraine, unspecified, not intractable, without status migrainosus: Secondary | ICD-10-CM | POA: Insufficient documentation

## 2013-04-10 MED ORDER — DEXAMETHASONE SODIUM PHOSPHATE 10 MG/ML IJ SOLN
10.0000 mg | Freq: Once | INTRAMUSCULAR | Status: AC
  Administered 2013-04-10: 10 mg via INTRAVENOUS
  Filled 2013-04-10: qty 1

## 2013-04-10 MED ORDER — SODIUM CHLORIDE 0.9 % IV BOLUS (SEPSIS)
1000.0000 mL | Freq: Once | INTRAVENOUS | Status: AC
  Administered 2013-04-10: 1000 mL via INTRAVENOUS

## 2013-04-10 MED ORDER — METOCLOPRAMIDE HCL 5 MG/ML IJ SOLN
10.0000 mg | Freq: Once | INTRAMUSCULAR | Status: AC
  Administered 2013-04-10: 10 mg via INTRAVENOUS
  Filled 2013-04-10: qty 2

## 2013-04-10 MED ORDER — DIPHENHYDRAMINE HCL 50 MG/ML IJ SOLN
25.0000 mg | Freq: Once | INTRAMUSCULAR | Status: AC
  Administered 2013-04-10: 25 mg via INTRAVENOUS
  Filled 2013-04-10: qty 1

## 2013-04-10 NOTE — ED Notes (Signed)
Patient c/o migraine that started today in the temporal area bilaterally. Patient denies sensitivity to light and sound, N/V, or vision changes.

## 2013-04-10 NOTE — ED Provider Notes (Signed)
CSN: [id number]     Arrival date & time 04/10/13  1135 History   First MD Initiated Contact with Patient 04/10/13 1158     Chief Complaint  Patient presents with  . Migraine   (Consider location/radiation/quality/duration/timing/severity/associated sxs/prior Treatment) Patient is a 27 y.o. male presenting with headaches. The history is provided by the patient. No language interpreter was used.  Headache Pain location:  Generalized Radiates to:  Does not radiate Severity currently:  8/10 Severity at highest:  3/10 Onset quality:  Unable to specify Duration:  5 hours Timing:  Constant Progression:  Improving Chronicity:  Recurrent Similar to prior headaches: no (lating longer, more severe)   Relieved by:  NSAIDs Worsened by:  Nothing tried Associated symptoms: no abdominal pain, no back pain, no blurred vision, no congestion, no cough, no diarrhea, no dizziness, no pain, no fatigue, no fever, no nausea, no numbness, no photophobia, no vomiting and no weakness     Past Medical History  Diagnosis Date  . Seasonal allergies    History reviewed. No pertinent past surgical history. No family history on file. History  Substance Use Topics  . Smoking status: Current Every Day Smoker    Types: Cigarettes  . Smokeless tobacco: Not on file  . Alcohol Use: Yes     Comment: once weekly    Review of Systems  Constitutional: Negative for fever, activity change, appetite change and fatigue.  HENT: Negative for congestion, facial swelling, rhinorrhea and trouble swallowing.   Eyes: Negative for blurred vision, photophobia and pain.  Respiratory: Negative for cough, chest tightness and shortness of breath.   Cardiovascular: Negative for chest pain and leg swelling.  Gastrointestinal: Negative for nausea, vomiting, abdominal pain, diarrhea and constipation.  Endocrine: Negative for polydipsia and polyuria.  Genitourinary: Negative for dysuria, urgency, decreased urine volume and  difficulty urinating.  Musculoskeletal: Negative for back pain and gait problem.  Skin: Negative for color change, rash and wound.  Allergic/Immunologic: Negative for immunocompromised state.  Neurological: Positive for headaches. Negative for dizziness, facial asymmetry, speech difficulty, weakness and numbness.  Psychiatric/Behavioral: Negative for confusion, decreased concentration and agitation.    Allergies  Codeine  Home Medications   Current Outpatient Rx  Name  Route  Sig  Dispense  Refill  . acetaminophen (TYLENOL) 500 MG tablet   Oral   Take 1,000 mg by mouth every 6 (six) hours as needed for mild pain, moderate pain or headache.          BP 108/75  Pulse 86  Temp(Src) 98.4 F (36.9 C) (Oral)  Resp 16  SpO2 98% Physical Exam  Constitutional: He is oriented to person, place, and time. He appears well-developed and well-nourished. No distress.  HENT:  Head: Normocephalic and atraumatic.  Mouth/Throat: No oropharyngeal exudate.  Eyes: Pupils are equal, round, and reactive to light.  Neck: Normal range of motion. Neck supple.  Cardiovascular: Normal rate, regular rhythm and normal heart sounds.  Exam reveals no gallop and no friction rub.   No murmur heard. Pulmonary/Chest: Effort normal and breath sounds normal. No respiratory distress. He has no wheezes. He has no rales.  Abdominal: Soft. Bowel sounds are normal. He exhibits no distension and no mass. There is no tenderness. There is no rebound and no guarding.  Musculoskeletal: Normal range of motion. He exhibits no edema and no tenderness.  Neurological: He is alert and oriented to person, place, and time. He has normal strength. He displays no tremor. No cranial nerve deficit or sensory  deficit. He exhibits normal muscle tone. He displays a negative Romberg sign. Coordination and gait normal. GCS eye subscore is 4. GCS verbal subscore is 5. GCS motor subscore is 6.  Skin: Skin is warm and dry.  Psychiatric: He has  a normal mood and affect.    ED Course  Procedures (including critical care time) Labs Review Labs Reviewed - No data to display Imaging Review No results found.  EKG Interpretation   None       MDM   1. Migraine   Pt presents w/ throbbing frontal h/a since 0630, was 8/10, now 3/10.  No assoc fever, chills, neck pain, numbness, weakness.  He states he has been under a lot of stress since the death of his father 6 months ago.  Hx of similar h/a in the past, but not normally lasting this long. Does not have thoughts of wanting to hurt himself.  PE including neuro exam benign. Will give migraine cocktail.   2:04 PM H/a resolved. Pt safe for d/c.  Return precautions given for new or worsening symptoms including worsening h/a, fever, numbness, weakness     Shanna Cisco, MD 04/10/13 2014

## 2013-04-10 NOTE — ED Notes (Signed)
Bed: [facility] Expected date:  Expected time:  Means of arrival:  Comments: 

## 2013-04-10 NOTE — Progress Notes (Signed)
[facility] provided pt with a list of primary care resources, a [employer] orange card application, and information about [facility].

## 2013-04-10 NOTE — ED Notes (Signed)
Pt c/o migraine that started this morning when he woke up around 0630.

## 2013-04-11 ENCOUNTER — Encounter (HOSPITAL_COMMUNITY): Payer: Self-pay | Admitting: Emergency Medicine

## 2013-04-11 ENCOUNTER — Emergency Department (HOSPITAL_COMMUNITY): Payer: Self-pay

## 2013-04-11 ENCOUNTER — Emergency Department (HOSPITAL_COMMUNITY)
Admission: EM | Admit: 2013-04-11 | Discharge: 2013-04-11 | Disposition: A | Discharge status: Home / Self Care | Payer: Self-pay | Attending: Emergency Medicine | Admitting: Emergency Medicine

## 2013-04-11 DIAGNOSIS — R079 Chest pain, unspecified: Secondary | ICD-10-CM

## 2013-04-11 DIAGNOSIS — F17200 Nicotine dependence, unspecified, uncomplicated: Secondary | ICD-10-CM | POA: Insufficient documentation

## 2013-04-11 DIAGNOSIS — R072 Precordial pain: Secondary | ICD-10-CM | POA: Insufficient documentation

## 2013-04-11 LAB — POCT I-STAT TROPONIN I: Troponin i, poc: 0.01 ng/mL (ref 0.00–0.08)

## 2013-04-11 NOTE — ED Notes (Signed)
Pt c/o epigastric pain after drinking orange juice, states his boss made him come for check up before he can come back

## 2013-04-11 NOTE — Progress Notes (Signed)
[facility] provided pt with a list of primary care resources. Patient stated that he was pending insurance.  °

## 2013-04-11 NOTE — ED Provider Notes (Signed)
CSN: [id number]     Arrival date & time 04/11/13  1212 History   First MD Initiated Contact with Patient 04/11/13 1227     Chief Complaint  Patient presents with  . Chest Pain   (Consider location/radiation/quality/duration/timing/severity/associated sxs/prior Treatment) Patient is a 27 y.o. male presenting with chest pain. The history is provided by the patient and medical records.  Chest Pain  This is a 27 year old male with no significant past medical history presenting to the ED for episode of chest pain.  Patient states he had a brief episode of sharp, stabbing, mid-sternal chest pain lasting approximately 1 minute after drinking a glass of orange juice at work. He denies any associated shortness of breath, palpitations, dizziness, or weakness. Patient states sx have fully resolved, but his employer sent him to the ED for evaluation prior to returning to work. Patient denies prior history of cardiac problems. No family history of sudden cardiac death.  Pt is a daily smoker.   Past Medical History  Diagnosis Date  . Seasonal allergies    History reviewed. No pertinent past surgical history. No family history on file. History  Substance Use Topics  . Smoking status: Current Every Day Smoker    Types: Cigarettes  . Smokeless tobacco: Not on file  . Alcohol Use: Yes     Comment: once weekly    Review of Systems  Cardiovascular: Positive for chest pain.  All other systems reviewed and are negative.    Allergies  Codeine  Home Medications   Current Outpatient Rx  Name  Route  Sig  Dispense  Refill  . acetaminophen (TYLENOL) 500 MG tablet   Oral   Take 1,000 mg by mouth every 6 (six) hours as needed for mild pain, moderate pain or headache.          BP 119/74  Pulse 98  Temp(Src) 97.6 F (36.4 C) (Oral)  Resp 20  SpO2 98%  Physical Exam  Nursing note and vitals reviewed. Constitutional: He is oriented to person, place, and time. He appears well-developed and  well-nourished. No distress.  HENT:  Head: Normocephalic and atraumatic.  Mouth/Throat: Oropharynx is clear and moist.  Eyes: Conjunctivae and EOM are normal. Pupils are equal, round, and reactive to light.  Neck: Normal range of motion. Neck supple.  Cardiovascular: Normal rate, regular rhythm and normal heart sounds.   Pulmonary/Chest: Effort normal and breath sounds normal. No respiratory distress. He has no wheezes.  Musculoskeletal: Normal range of motion.  Neurological: He is alert and oriented to person, place, and time.  Skin: Skin is warm and dry. He is not diaphoretic.  Psychiatric: He has a normal mood and affect.    ED Course  Procedures (including critical care time)   Date: 04/11/2013  Rate: 93  Rhythm: normal sinus rhythm  QRS Axis: normal  Intervals: normal  ST/T Wave abnormalities: nonspecific ST/T changes  Conduction Disutrbances:none  Narrative Interpretation: early repolarization pattern, no STEMI  Old EKG Reviewed: unchanged   Labs Review Labs Reviewed  POCT I-STAT TROPONIN I   Imaging Review No results found.  EKG Interpretation   None       MDM   1. Chest pain    Patient with episode of midsternal chest pain after drinking orange juice. Patient is pain free on arrival.  Sx likely due to GERD/ esophageal spasm.  EKG NSR, no acute ischemic changes.  Trop negative. Pt refused CXR.  At this time I doubt ACS, PE, dissection, or other  acute cardiac event.  Pt will be discharged with strict return precautions should sx change or worsen-- pt acknowledged understanding and agreed.  Garlon Hatchet, PA-C 04/11/13 [phone]

## 2013-04-11 NOTE — ED Provider Notes (Signed)
Medical screening examination/treatment/procedure(s) were performed by non-physician practitioner and as supervising physician I was immediately available for consultation/collaboration.  EKG Interpretation    Date/Time:  Thursday April 11 2013 12:20:45 EST Ventricular Rate:  93 PR Interval:  149 QRS Duration: 101 QT Interval:  339 QTC Calculation: 422 R Axis:   44 Text Interpretation:  Sinus rhythm Consider left atrial enlargement ST elev, probable normal early repol pattern Unchanged from prior.  Confirmed by Micheline Maze  MD, MEGAN [phone]) on 04/11/2013 4:25:03 PM              Shanna Cisco, MD 04/11/13 [id number]

## 2013-04-12 ENCOUNTER — Encounter (HOSPITAL_COMMUNITY): Payer: Self-pay | Admitting: Emergency Medicine

## 2013-04-12 ENCOUNTER — Other Ambulatory Visit: Payer: Self-pay

## 2013-04-12 ENCOUNTER — Emergency Department (HOSPITAL_COMMUNITY): Payer: Self-pay

## 2013-04-12 ENCOUNTER — Emergency Department (HOSPITAL_COMMUNITY)
Admission: EM | Admit: 2013-04-12 | Discharge: 2013-04-12 | Disposition: A | Discharge status: Home / Self Care | Payer: Self-pay | Attending: Emergency Medicine | Admitting: Emergency Medicine

## 2013-04-12 DIAGNOSIS — F17200 Nicotine dependence, unspecified, uncomplicated: Secondary | ICD-10-CM | POA: Insufficient documentation

## 2013-04-12 DIAGNOSIS — R079 Chest pain, unspecified: Secondary | ICD-10-CM | POA: Insufficient documentation

## 2013-04-12 DIAGNOSIS — K219 Gastro-esophageal reflux disease without esophagitis: Secondary | ICD-10-CM | POA: Insufficient documentation

## 2013-04-12 DIAGNOSIS — Z79899 Other long term (current) drug therapy: Secondary | ICD-10-CM | POA: Insufficient documentation

## 2013-04-12 LAB — COMPREHENSIVE METABOLIC PANEL
Albumin: 3.6 g/dL (ref 3.5–5.2)
BUN: 17 mg/dL (ref 6–23)
CO2: 24 mEq/L (ref 19–32)
Chloride: 107 mEq/L (ref 96–112)
Creatinine, Ser: 1.01 mg/dL (ref 0.50–1.35)
GFR calc Af Amer: >90 mL/min (ref >90)
GFR calc non Af Amer: >90 mL/min (ref >90)
Glucose, Bld: 116 mg/dL — ABNORMAL HIGH (ref 70–99)
Total Bilirubin: 0.3 mg/dL (ref 0.3–1.2)

## 2013-04-12 LAB — CBC
Hemoglobin: 15.2 g/dL (ref 13.0–17.0)
MCH: 23.5 pg — ABNORMAL LOW (ref 26.0–34.0)
MCHC: 32.5 g/dL (ref 30.0–36.0)
MCV: 72.1 fL — ABNORMAL LOW (ref 78.0–100.0)
Platelets: 223 10*3/uL (ref 150–400)
RBC: 6.48 MIL/uL — ABNORMAL HIGH (ref 4.22–5.81)

## 2013-04-12 MED ORDER — POTASSIUM CHLORIDE CRYS ER 20 MEQ PO TBCR
40.0000 meq | EXTENDED_RELEASE_TABLET | Freq: Once | ORAL | Status: AC
  Administered 2013-04-12: 40 meq via ORAL
  Filled 2013-04-12: qty 2

## 2013-04-12 MED ORDER — OMEPRAZOLE 20 MG PO CPDR: 20.0000 mg | DELAYED_RELEASE_CAPSULE | Freq: Every day | ORAL | Status: AC

## 2013-04-12 NOTE — ED Provider Notes (Signed)
CSN: [id number]     Arrival date & time 04/12/13  1552 History   First MD Initiated Contact with Patient 04/12/13 1931      This chart was scribed for non-physician practitioner, Francee Piccolo PA-C working with Layla Maw Ward, DO by Arlan Organ, ED Scribe. This patient was seen in room TR07C/TR07C and the patient's care was started at 8:12 PM.   Chief Complaint  Patient presents with  . Chest Pain   The history is provided by the patient. No language interpreter was used.     HPI Comments: Bobby Barrett is a 27 y.o. male who presents to the Emergency Department complaining of gradually worsening, intermittent CP that started yesterday. Pt reports waking up with morning around 6:15 AM with severe burning pain to his chest w/o radiation. He says he initially thought it may have been acid reflex, but noticed the pain never resolved on its own. Pt states he initially experienced a similar episode yesterday that he says lasted for 2 to 3 minutes. He says the CP today was worsened significantly. He describes the pain as "burning", and rated the pain 7/10 earlier today. Patient states he took two acid reflux pills this morning with minimal relief. He states he took two Tums around 12:30PM today and has been pain free since that time. Pt is currently a smoker. Pt is PERC negative.  Past Medical History  Diagnosis Date  . Seasonal allergies    History reviewed. No pertinent past surgical history. History reviewed. No pertinent family history. History  Substance Use Topics  . Smoking status: Current Every Day Smoker -- 1.00 packs/day    Types: Cigarettes  . Smokeless tobacco: Not on file  . Alcohol Use: Yes     Comment: once weekly    Review of Systems  Constitutional: Negative for fever.  Respiratory: Negative for cough and shortness of breath.   Cardiovascular: Positive for chest pain.  Gastrointestinal: Negative for nausea, vomiting and abdominal pain.  All other systems reviewed  and are negative.    Allergies  Codeine  Home Medications   Current Outpatient Rx  Name  Route  Sig  Dispense  Refill  . acetaminophen (TYLENOL) 500 MG tablet   Oral   Take 1,000 mg by mouth 2 (two) times daily as needed (pain).          Marland Kitchen omeprazole (PRILOSEC) 20 MG capsule   Oral   Take 1 capsule (20 mg total) by mouth daily.   15 capsule   0    Triage Vitals: BP 96/70  Pulse 86  Temp(Src) 98.3 F (36.8 C) (Oral)  Resp 15  Ht 5\' 7"  (1.702 m)  Wt 195 lb 4.8 oz (88.587 kg)  BMI 30.58 kg/m2  SpO2 99%  Physical Exam  Nursing note and vitals reviewed. Constitutional: He is oriented to person, place, and time. He appears well-developed and well-nourished. No distress.  HENT:  Head: Normocephalic and atraumatic.  Right Ear: External ear normal.  Left Ear: External ear normal.  Nose: Nose normal.  Mouth/Throat: Oropharynx is clear and moist. No oropharyngeal exudate.  Eyes: Conjunctivae and EOM are normal. Pupils are equal, round, and reactive to light.  Neck: Normal range of motion. Neck supple.  Cardiovascular: Normal rate, regular rhythm and normal heart sounds.   Pulmonary/Chest: Effort normal and breath sounds normal.  Abdominal: Soft. Bowel sounds are normal. There is no tenderness.  Musculoskeletal: Normal range of motion.  Neurological: He is alert and oriented to person, place,  and time.  Skin: Skin is warm and dry. He is not diaphoretic.  Psychiatric: He has a normal mood and affect. His behavior is normal.    ED Course  Procedures (including critical care time) Medications  potassium chloride SA (K-DUR,KLOR-CON) CR tablet 40 mEq (40 mEq Oral Given 04/12/13 1958)     DIAGNOSTIC STUDIES: Oxygen Saturation is 99% on RA, Normal by my interpretation.     Labs Review Labs Reviewed  COMPREHENSIVE METABOLIC PANEL - Abnormal; Notable for the following:    Potassium 3.4 (*)    Glucose, Bld 116 (*)    ALT 54 (*)    All other components within normal  limits  CBC - Abnormal; Notable for the following:    RBC 6.48 (*)    MCV 72.1 (*)    MCH 23.5 (*)    All other components within normal limits  POCT I-STAT TROPONIN I   Imaging Review Dg Chest 2 View  04/12/2013   CLINICAL DATA:  Chest pain, shortness of breath and weakness. Smoker.  EXAM: CHEST  2 VIEW  COMPARISON:  None.  FINDINGS: Normal sized heart. Clear lungs. Mild central peribronchial thickening. Minimal scoliosis.  IMPRESSION: Mild bronchitic changes.   Electronically Signed   By: Gordan Payment M.D.   On: 04/12/2013 17:08    EKG Interpretation    Date/Time:  Friday April 12 2013 15:57:13 EST Ventricular Rate:  120 PR Interval:  138 QRS Duration: 102 QT Interval:  324 QTC Calculation: 457 R Axis:   63 Text Interpretation:  Sinus tachycardia Possible Lateral infarct , age undetermined Cannot rule out Inferior infarct , age undetermined Abnormal ECG Confirmed by WARD  DO, KRISTEN ([id number]) on 04/12/2013 8:25:51 PM           Filed Vitals:   04/12/13 1827  BP: 96/70  Pulse: 86  Temp: 98.3 F (36.8 C)  Resp: 15    MDM   1. Chest pain   2. Acid reflux     Afebrile, NAD, non-toxic appearing, AAOx4. Patient is to be discharged with recommendation to follow up with PCP in regards to today's hospital visit. Chest pain is not likely of cardiac or pulmonary etiology d/t presentation, perc negative, VSS, no tracheal deviation, no JVD or new murmur, RRR, breath sounds equal bilaterally, EKG without acute abnormalities, negative troponin, and negative CXR. Initially tachycardic, but NSR. Pt has been advised start a PPI and return to the ED is CP becomes exertional, associated with diaphoresis or nausea, radiates to left jaw/arm, worsens or becomes concerning in any way. Pt appears reliable for follow up and is agreeable to discharge. Patient is stable at time of discharge   Case has been discussed with and seen by Dr. Elesa Massed who agrees with the above plan to discharge.     I  personally performed the services described in this documentation, which was scribed in my presence. The recorded information has been reviewed and is accurate.    Jeannetta Ellis, PA-C 04/12/13 2135

## 2013-04-12 NOTE — ED Notes (Signed)
Chest pain started today, pt woke up around 0615 and was hurting really bad. Pt thought it was acid reflux so tried to sleep it off. However, pain did not go away. Pt states the pain is throbbing in the middle of his chest. Pt feels sweaty and was SOB this morning but not now. Pt said it hurts to take a deep breath.

## 2013-04-12 NOTE — ED Notes (Signed)
RN updated pt on delay

## 2013-04-13 NOTE — ED Provider Notes (Signed)
Medical screening examination/treatment/procedure(s) were performed by non-physician practitioner and as supervising physician I was immediately available for consultation/collaboration.  EKG Interpretation    Date/Time:  Friday April 12 2013 15:57:13 EST Ventricular Rate:  120 PR Interval:  138 QRS Duration: 102 QT Interval:  324 QTC Calculation: 457 R Axis:   63 Text Interpretation:  Sinus tachycardia Possible Lateral infarct , age undetermined Cannot rule out Inferior infarct , age undetermined Abnormal ECG Confirmed by Maliha Outten  DO, Azalie Harbeck ([id number]) on 04/12/2013 8:25:51 PM              Bobby Maw [name], DO 04/13/13 0000

## 2014-11-13 ENCOUNTER — Encounter (HOSPITAL_COMMUNITY): Payer: Self-pay | Admitting: *Deleted

## 2014-11-13 ENCOUNTER — Emergency Department (HOSPITAL_COMMUNITY)
Admission: EM | Admit: 2014-11-13 | Discharge: 2014-11-13 | Disposition: A | Discharge status: Home / Self Care | Payer: Self-pay | Attending: Emergency Medicine | Admitting: Emergency Medicine

## 2014-11-13 DIAGNOSIS — Z720 Tobacco use: Secondary | ICD-10-CM | POA: Insufficient documentation

## 2014-11-13 DIAGNOSIS — F1010 Alcohol abuse, uncomplicated: Secondary | ICD-10-CM | POA: Insufficient documentation

## 2014-11-13 DIAGNOSIS — M7989 Other specified soft tissue disorders: Secondary | ICD-10-CM | POA: Insufficient documentation

## 2014-11-13 DIAGNOSIS — Z87828 Personal history of other (healed) physical injury and trauma: Secondary | ICD-10-CM | POA: Insufficient documentation

## 2014-11-13 DIAGNOSIS — M79675 Pain in left toe(s): Secondary | ICD-10-CM | POA: Insufficient documentation

## 2014-11-13 DIAGNOSIS — Z79899 Other long term (current) drug therapy: Secondary | ICD-10-CM | POA: Insufficient documentation

## 2014-11-13 NOTE — ED Notes (Signed)
Pt states L great toe pain.  States hx of injury several years ago and pain flares up every 6 months or so.

## 2014-11-13 NOTE — Discharge Instructions (Signed)
His monitor for new or worsening signs of infection, return immediately if any present. His follow-up with your primary care provider for further evaluation and management. As needed for the pain

## 2014-11-13 NOTE — ED Provider Notes (Signed)
CSN: [id number]     Arrival date & time 11/13/14  1049 History  This chart was scribed for non-physician practitioner Burna Forts, PA, working with Raeford Razor, MD, by Tanda Rockers, ED Scribe. This patient was seen in room TR08C/TR08C and the patient's care was started at 11:15 AM.  Chief Complaint  Patient presents with  . Foot Pain   The history is provided by the patient. No language interpreter was used.     HPI Comments: Bobby Barrett is a 29 y.o. male who presents to the Emergency Department complaining of intermittent left great toe pain x 1 week. He describes the pain as sharp and shooting in sensation. The pain is exacerbated with applying pressure to the foot. He notes increased swelling to the foot as well. Pt has been applying BenGay, Ice, and taking Advil without relief. Pt states that he injured his foot 2 years ago after dropping a piece of metal onto his foot while at work. The pain returns approximately every 4-6 months and lasts about 2 days before resolving on its own. Pt was evaluated after his injury and states he was placed in a boot but cannot say if he had a fracture. 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 No recent injury, trauma, or fall.  Pt denies excess red meat intake. Pt is occasional EtOH drinker. He admits to drinking more than usual this past weekend. The pain was present before he began drinking.  Denies fever, chills, nausea, vomiting, abdominal pain, or any other symptoms.   Past Medical History  Diagnosis Date  . Seasonal allergies    History reviewed. No pertinent past surgical history. No family history on file. History  Substance Use Topics  . Smoking status: Current Every Day Smoker -- 0.50 packs/day    Types: Cigarettes  . Smokeless tobacco: Not on file  . Alcohol Use: Yes     Comment: once weekly    Review of Systems  All other systems reviewed and are negative.  Allergies  Codeine  Home Medications   Prior to  Admission medications   Medication Sig Start Date End Date Taking? Authorizing Provider  acetaminophen (TYLENOL) 500 MG tablet Take 1,000 mg by mouth 2 (two) times daily as needed (pain).     Historical Provider, MD  omeprazole (PRILOSEC) 20 MG capsule Take 1 capsule (20 mg total) by mouth daily. 04/12/13   Francee Piccolo, PA-C   Triage Vitals: BP 105/79 mmHg  Pulse 118  Temp(Src) 97.7 F (36.5 C) (Oral)  Resp 20  SpO2 100%   Physical Exam  Constitutional: He is oriented to person, place, and time. He appears well-developed and well-nourished. No distress.  HENT:  Head: Normocephalic and atraumatic.  Eyes: Conjunctivae and EOM are normal.  Neck: Neck supple. No tracheal deviation present.  Cardiovascular: Regular rhythm, normal heart sounds and intact distal pulses.  Exam reveals no gallop and no friction rub.   No murmur heard. Pulmonary/Chest: Effort normal and breath sounds normal. No respiratory distress. He has no wheezes. He has no rales. He exhibits no tenderness.  Musculoskeletal: Normal range of motion.  No swelling, erythema noted to left great toe Pain with flexion and extension No pain with medial lateral deviation Tenderness to palpation of the plantar surface about the great toe No surrounding cellulitis or signs of infection   Neurological: He is alert and oriented to person, place, and time. He has normal strength. No cranial nerve deficit or sensory deficit. Coordination and  gait normal. GCS eye subscore is 4. GCS verbal subscore is 5. GCS motor subscore is 6.  Skin: Skin is warm and dry.  Psychiatric: He has a normal mood and affect. His behavior is normal.  Nursing note and vitals reviewed.   ED Course  Procedures (including critical care time)  DIAGNOSTIC STUDIES: Oxygen Saturation is 100% on RA, normal by my interpretation.    COORDINATION OF CARE: 11:22 AM-Discussed treatment plan which includes OTC Ibuprofen, Ice, Elevation, and referral to  podiatrist with pt at bedside and pt agreed to plan.   Labs Review Labs Reviewed - No data to display  Imaging Review No results found.   EKG Interpretation None      MDM   Final diagnoses:  Pain of toe of left foot    Labs: None  Imaging: None  Consults: None  Therapeutics: None  Assessment:  Plan: Patient presents with great toe pain. No signs on exam that would indicate septic joint, gouty, or any significant pathology. Patient is able to ambulate without difficulty. Patient requesting work note for the last 2 days. Symptomatic instructions given to patient, check return precautions, she verbalizes understanding and agreement with today's plan    I personally performed the services described in this documentation, which was scribed in my presence. The recorded information has been reviewed and is accurate.      Eyvonne MechanicJeffrey Vadis Slabach, PA-C 11/13/14 1643  Raeford RazorStephen Kohut, MD 11/21/14 1330

## 2015-01-01 ENCOUNTER — Encounter (HOSPITAL_COMMUNITY): Payer: Self-pay | Admitting: *Deleted

## 2015-01-01 ENCOUNTER — Emergency Department (HOSPITAL_COMMUNITY)
Admission: EM | Admit: 2015-01-01 | Discharge: 2015-01-01 | Disposition: A | Discharge status: Home / Self Care | Payer: Self-pay | Attending: Emergency Medicine | Admitting: Emergency Medicine

## 2015-01-01 DIAGNOSIS — Z720 Tobacco use: Secondary | ICD-10-CM | POA: Insufficient documentation

## 2015-01-01 DIAGNOSIS — R51 Headache: Secondary | ICD-10-CM | POA: Insufficient documentation

## 2015-01-01 DIAGNOSIS — K226 Gastro-esophageal laceration-hemorrhage syndrome: Secondary | ICD-10-CM | POA: Insufficient documentation

## 2015-01-01 DIAGNOSIS — R519 Headache, unspecified: Secondary | ICD-10-CM

## 2015-01-01 DIAGNOSIS — Z79899 Other long term (current) drug therapy: Secondary | ICD-10-CM | POA: Insufficient documentation

## 2015-01-01 DIAGNOSIS — K920 Hematemesis: Secondary | ICD-10-CM | POA: Insufficient documentation

## 2015-01-01 LAB — CBC WITH DIFFERENTIAL/PLATELET
Basophils Absolute: 0 10*3/uL (ref 0.0–0.1)
Basophils Relative: 0 % (ref 0–1)
Eosinophils Absolute: 0.2 10*3/uL (ref 0.0–0.7)
Eosinophils Relative: 3 % (ref 0–5)
HCT: 46.5 % (ref 39.0–52.0)
Hemoglobin: 15 g/dL (ref 13.0–17.0)
Lymphocytes Relative: 34 % (ref 12–46)
Lymphs Abs: 2.4 10*3/uL (ref 0.7–4.0)
MCH: 23.2 pg — ABNORMAL LOW (ref 26.0–34.0)
MCHC: 32.3 g/dL (ref 30.0–36.0)
MCV: 72 fL — ABNORMAL LOW (ref 78.0–100.0)
Monocytes Absolute: 0.5 10*3/uL (ref 0.1–1.0)
Monocytes Relative: 7 % (ref 3–12)
Neutro Abs: 4.1 10*3/uL (ref 1.7–7.7)
Neutrophils Relative %: 56 % (ref 43–77)
Platelets: 192 10*3/uL (ref 150–400)
RBC: 6.46 MIL/uL — ABNORMAL HIGH (ref 4.22–5.81)
RDW: 15.6 % — ABNORMAL HIGH (ref 11.5–15.5)
WBC: 7.2 10*3/uL (ref 4.0–10.5)

## 2015-01-01 LAB — BASIC METABOLIC PANEL
Anion gap: 8 (ref 5–15)
BUN: 11 mg/dL (ref 6–20)
CO2: 26 mmol/L (ref 22–32)
Calcium: 9.2 mg/dL (ref 8.9–10.3)
Chloride: 106 mmol/L (ref 101–111)
Creatinine, Ser: 1.16 mg/dL (ref 0.61–1.24)
GFR calc Af Amer: >60 mL/min (ref >60)
GFR calc non Af Amer: >60 mL/min (ref >60)
Glucose, Bld: 133 mg/dL — ABNORMAL HIGH (ref 65–99)
Potassium: 3.8 mmol/L (ref 3.5–5.1)
Sodium: 140 mmol/L (ref 135–145)

## 2015-01-01 LAB — URINALYSIS, ROUTINE W REFLEX MICROSCOPIC
Bilirubin Urine: NEGATIVE
Glucose, UA: NEGATIVE mg/dL
Ketones, ur: NEGATIVE mg/dL
Leukocytes, UA: NEGATIVE
Nitrite: NEGATIVE
Protein, ur: NEGATIVE mg/dL
Specific Gravity, Urine: 1.028 (ref 1.005–1.030)
Urobilinogen, UA: 0.2 mg/dL (ref 0.0–1.0)
pH: 5.5 (ref 5.0–8.0)

## 2015-01-01 LAB — URINE MICROSCOPIC-ADD ON

## 2015-01-01 NOTE — Discharge Instructions (Signed)
Mallory-Weiss Syndrome Mallory-Weiss syndrome refers to bleeding from tears in the lining of the esophagus near where it meets the stomach. This is often caused by forceful vomiting, retching or coughing. Usually the bleeding stops by itself after 24 to 48 hours. Sometimes endoscopic or surgical treatment is needed. This condition is not usually fatal. SYMPTOMS  Vomiting of bright red or black coffee ground like material.  Black, tarry stools.  Low blood pressure causing you to feel faint or experience loss of consciousness. DIAGNOSIS  Definitive diagnosis is by endoscopy. Treatment is usually supportive. Persistent bleeding is uncommon. Sometimes cauterization or injection of epinephrine to stop the bleeding is used during the diagnostic endoscopy. Embolization (obstruction) of the arteries supplying the area of bleeding is sometimes used to stop the bleeding.  An NG tube (naso-gastric tube) may be inserted to determine where the bleeding is coming from.  Often an EGD (esophagogastroduodenoscopy) is done. In this procedure there is a small flexible tube-like telescope (endoscope) put into your mouth, through your esophagus (the food tube leading from your mouth to your stomach), down into your stomach and into the small bowel. Through this your caregiver can see what and where the problem is. TREATMENT  It is necessary to stop the bleeding as soon as possible. During the EGD, your caregiver may inject medication into bleeding vessels to clot them.  SEEK IMMEDIATE MEDICAL CARE IF:  You have persistent dizziness, lightheadedness, or fainting.  Your vomiting returns and you have blood in your stools.  You have vomit that is bright red blood or black coffee ground-like blood, bright red blood in the stool or black tarry stools.  You have chest pain.  You cannot eat or drink.  You have nausea or vomiting. MAKE SURE YOU:   Understand these instructions.  Will watch your  condition.  Will get help right away if you are not doing well or get worse. Document Released: 09/12/2005 Document Revised: 07/18/2011 Document Reviewed: 06/19/2013 [facility] Patient Information 2015 [employer], [place]. This information is not intended to replace advice given to you by your health care provider. Make sure you discuss any questions you have with your health care provider.   Emergency Department Resource Guide 1) Find a Doctor and Pay Out of Pocket Although you won't have to find out who is covered by your insurance plan, it is a good idea to ask around and get recommendations. You will then need to call the office and see if the doctor you have chosen will accept you as a new patient and what types of options they offer for patients who are self-pay. Some doctors offer discounts or will set up payment plans for their patients who do not have insurance, but you will need to ask so you aren't surprised when you get to your appointment.  2) Contact Your Local Health Department Not all health departments have doctors that can see patients for sick visits, but many do, so it is worth a call to see if yours does. If you don't know where your local health department is, you can check in your phone book. The CDC also has a tool to help you locate your state's health department, and many state websites also have listings of all of their local health departments.  3) Find a Walk-in Clinic If your illness is not likely to be very severe or complicated, you may want to try a walk in clinic. These are popping up all over the country in pharmacies, drugstores, and shopping  centers. They're usually staffed by nurse practitioners or physician assistants that have been trained to treat common illnesses and complaints. They're usually fairly quick and inexpensive. However, if you have serious medical issues or chronic medical problems, these are probably not your best option.  No Primary Care  Doctor: - Call Health Connect at  [phone] - they can help you locate a primary care doctor that  accepts your insurance, provides certain services, etc. - Physician Referral Service- [phone]  Chronic Pain Problems: Organization         Address  Phone   Notes  [facility]  [phone] Patients need to be referred by their primary care doctor.   Medication Assistance: Organization         Address  Phone   Notes  [facility] Medication [facility] [address] [place] [place], [place] [address] [phone] --Must be a resident of [facility] -- Must have NO insurance coverage whatsoever (no Medicaid/ Medicare, etc.) -- The pt. MUST have a primary care doctor that directs their care regularly and follows them in the community   [facility]  [phone]   [employer]  [phone]    Agencies that provide inexpensive medical care: Organization         Address  Phone   Notes  [facility]  [phone]   [facility]    [phone]   [facility] [address] [place], [place] [address] [phone]   [facility] [address] [place], [place] [phone]   Planned Parenthood    [phone]   [facility]    [phone]   [facility]  [address], [place] Phone:  [phone], Fax:  [phone] Hours of Operation:  9 am - 6 pm, M-F.  Also accepts Medicaid/Medicare and self-pay.  [facility]  [address], [place] Phone: [phone], Fax: [phone]. Hours of Operation:  8:30 am - 5:30 pm, M-F.  Also accepts Medicaid and self-pay.  [facility] [place] [address] [place] Phone: [phone]   [facility] [address] [place], [place] [phone], Ext. [phone] Mondays & Thursdays: 7-9 AM.  First 15 patients are seen on a first  come, first serve basis.    Medicaid-accepting [facility] Providers:  Organization         Address  Phone   Notes  [facility] [address], [place] [phone] Also accepts self-pay patients.  [facility] [address] Laurell Josephs [place], [place]  [phone]   [facility] [address], [place] [phone]   [facility] [address], [place] [phone]   Renaye Rakers [address], [place]   [phone] Only accepts [place] Access [place] patients after they have their name applied to their card.   Self-Pay (no insurance) in [facility]:  Organization         Address  Phone   Notes  Sickle Cell Patients, [facility] Internal Medicine [address] [place], [place] [phone]   [facility] [address] [place], [place] [phone]   [facility] [place]  [address], [place] [phone]  [facility]/Dr. Osei-Bonsu  [address] [place] or [address], [place] [phone] Phone number for both [place] and [place] locations is the same.  [facility] [address] [place] [phone]   [facility] [address], [place] or [address] [phone]   [facility] [address] [place] [phone], phone; [phone], fax Sees patients 1st and 3rd Saturday of every month.  Must not qualify for public or private insurance (i.e. Medicaid, Medicare, [place] Health Choice, Veterans' Benefits)  Household income should be no more than 200% of the poverty level The clinic cannot treat you if you are pregnant or think you are pregnant  Sexually transmitted diseases are not treated at the clinic.    Dental Care: Organization          Address  Phone  Notes  [facility] Department of [facility] [address] [place], [place] [phone] Accepts children up to age 53 who are enrolled in [place] or [place] Health Choice; pregnant women with a Medicaid card; and children who have applied for Medicaid or [place] Health Choice, but were declined, whose parents can pay a reduced fee at time of service.  [facility] Department of [facility]  [address], [place] [phone] Accepts children up to age 58 who are enrolled in [place] or [place] Health Choice; pregnant women with a Medicaid card; and children who have applied for Medicaid or [place] Health Choice, but were declined, whose parents can pay a reduced fee at time of service.  [facility]  [address] [place], [place] [phone] Patients are seen by appointment only. Walk-ins are not accepted. [facility] will see patients 102 years of age and older. Monday - Tuesday (8am-5pm) Most Wednesdays (8:30-5pm) $30 per visit, cash only  [facility] Adult Dental Access PROGRAM  [address], [facility] [phone] Patients are seen by appointment only. Walk-ins are not accepted. [facility] will see patients 73 years of age and older. One Wednesday Evening (Monthly: Volunteer Based).  $30 per visit, cash only  [employer]  [phone] for adults; Children under age 40, call Graduate Pediatric Dentistry at [phone]. Children aged 74-14, please call [phone] to request a pediatric application.  Dental services are provided in all areas of dental care including fillings, crowns and bridges, complete and partial dentures, implants, gum treatment, root canals, and extractions. Preventive care is also provided. Treatment is provided to both adults and children. Patients are selected via a lottery and there is often a waiting list.   [facility] [address] [place]  [phone] [email or web address]   [facility] [address] [place], [place] [phone], Ext. [phone] Second and Fourth Thursday of each month, opens at 6:30 AM; Clinic ends at 9 AM.  Patients are seen on a first-come first-served basis, and a limited number are seen during each clinic.   [facility]  [address] [place], [place] [phone]   Eligibility Requirements You must have lived in [place], [place], or [place] counties for at least the last three months.   You cannot be eligible for state or federal sponsored [place], including [employer], [place], or [employer].   You generally cannot be eligible for healthcare insurance  through your employer.    How to apply: Eligibility screenings are held every Tuesday and Wednesday afternoon from 1:00 pm until 4:00 pm. You do not need an appointment for the interview!  [facility] [address] [place], [place] [phone]   [facility] Health Department  [phone]   [facility] Health Department  [phone]   [facility] Health Department  [phone]    Behavioral Health Resources in the Community: Intensive Outpatient Programs Organization         Address  Phone  Notes  [facility] [address] [place], [place] [phone]   [facility] [address], [place], [place] [phone]   [facility] [address] [place], [place]  [phone]   [facility] [address],  [place], [place] [phone] or [phone]   Substance Abuse Resources Organization         Address  Phone  Notes  [facility]  [phone]   [facility]  [phone]   [facility]  [phone]   Floydene Flock  [phone]   Residential & Outpatient Substance Abuse Program  [phone]   Psychological  Services Organization         Address  Phone  Notes  [facility]  [phone]   [facility]  [phone]   [facility] [address] [place] [phone] or [phone]    Mobile Crisis Teams Organization         Address  Phone  Notes  [facility], Mobile Crisis Care Unit  [phone]   [facility]  [address] [place], [place] [phone]   Doristine Locks [address] [place] [phone]    Self-Help/Support Groups Organization         Address  Phone             Notes  [facility] [place] - variety of support groups  336- I7437963 Call for more information  Narcotics Anonymous (NA), [facility] [address], [employer] [place]  2 meetings at this location   Statistician         Address  Phone  Notes  [facility] [address],    [place]  [phone]   [facility]  [address] [place] [address], [place], [place] [phone]   [facility] [address] [place], [place] [phone] Admissions: 8am-3pm M-F  [facility] [address]    [place], [place] [phone]   [facility] [address] [place], [place], [place] [phone]   [facility] [address]  [place], [place] [phone]   [facility] - Intensive Outpatient [address]ure[address], [place], [place] [phone]   [facility] ([facility] [address] [place]  [place], [place] [phone] or [phone]   [facility] ([facility]) [address], [place], [place] [phone] Accepts Medicaid  [facility] [address]  [place] [phone] Substance Abuse/Addiction Treatment   [facility] Organization         Address  Phone  Notes  [facility]  [phone]   Angie Fava, PhD [address] [place], [place]   [phone] or [phone]   [facility] Behavioral   [address] [place], [place] [phone]  [facility] [address] [place], [place] [phone] Insurance/Medicaid/sponsorship through [employer] and Families [address] [place], [place] [phone] [place], [place] [phone]    Dr. Adele Schilder  [phone]   [facility] Dept. 1) [address], [place] 2) [place] 3)  [place] [address], [place] [phone]  [phone]   [place] [phone] or [phone] (After Hours)

## 2015-01-01 NOTE — ED Notes (Signed)
Patient presents with c/o headache (thought it was a migraine - has a history) and "I threw up 2 times today and there was blood"  Patient works outside

## 2015-01-08 NOTE — ED Provider Notes (Signed)
CSN: [id number]     Arrival date & time 01/01/15  1847 History   First MD Initiated Contact with Patient 01/01/15 2022     Chief Complaint  Patient presents with  . Headache  . Hematemesis     (Consider location/radiation/quality/duration/timing/severity/associated sxs/prior Treatment) HPI   28yM with headache. Onset two days ago. Denies trauma. No fever or chills. Currently resolved. Did have nausea and vomiting x2 earlier today. Noticed small amount of blood mixed in emesis second time. Denies abdominal pain. No blood thinners or hx of GIB. No dizziness or lightheadness. Came to ED because family insisted he be evaluated and he is also requesting a work note.   Past Medical History  Diagnosis Date  . Seasonal allergies    History reviewed. No pertinent past surgical history. No family history on file. Social History  Substance Use Topics  . Smoking status: Current Every Day Smoker -- 0.50 packs/day    Types: Cigarettes  . Smokeless tobacco: Former Neurosurgeon  . Alcohol Use: Yes     Comment: once weekly    Review of Systems  All systems reviewed and negative, other than as noted in HPI.   Allergies  Codeine  Home Medications   Prior to Admission medications   Medication Sig Start Date End Date Taking? Authorizing Provider  acetaminophen (TYLENOL) 500 MG tablet Take 1,000 mg by mouth 2 (two) times daily as needed (pain).     Historical Provider, MD  omeprazole (PRILOSEC) 20 MG capsule Take 1 capsule (20 mg total) by mouth daily. 04/12/13   Jennifer Piepenbrink, PA-C   BP 121/93 mmHg  Pulse 87  Temp(Src) 97.7 F (36.5 C) (Oral)  Resp 18  Ht 5\' 7"  (1.702 m)  Wt 186 lb 9.6 oz (84.641 kg)  BMI 29.22 kg/m2  SpO2 100% Physical Exam  Constitutional: He is oriented to person, place, and time. He appears well-developed and well-nourished. No distress.  HENT:  Head: Normocephalic and atraumatic.  Oropharynx clear  Eyes: Conjunctivae are normal. Right eye exhibits no  discharge. Left eye exhibits no discharge.  Neck: Neck supple.  Cardiovascular: Normal rate, regular rhythm and normal heart sounds.  Exam reveals no gallop and no friction rub.   No murmur heard. Pulmonary/Chest: Effort normal and breath sounds normal. No respiratory distress.  Abdominal: Soft. He exhibits no distension. There is no tenderness.  Musculoskeletal: He exhibits no edema or tenderness.  Neurological: He is alert and oriented to person, place, and time. No cranial nerve deficit. He exhibits normal muscle tone. Coordination normal.  Skin: Skin is warm and dry.  Psychiatric: He has a normal mood and affect. His behavior is normal. Thought content normal.  Nursing note and vitals reviewed.   ED Course  Procedures (including critical care time) Labs Review Labs Reviewed  CBC WITH DIFFERENTIAL/PLATELET - Abnormal; Notable for the following:    RBC 6.46 (*)    MCV 72.0 (*)    MCH 23.2 (*)    RDW 15.6 (*)    All other components within normal limits  BASIC METABOLIC PANEL - Abnormal; Notable for the following:    Glucose, Bld 133 (*)    All other components within normal limits  URINALYSIS, ROUTINE W REFLEX MICROSCOPIC (NOT AT [facility]) - Abnormal; Notable for the following:    Color, Urine AMBER (*)    Hgb urine dipstick SMALL (*)    All other components within normal limits  URINE MICROSCOPIC-ADD ON - Abnormal; Notable for the following:    Bacteria,  UA FEW (*)    All other components within normal limits    Imaging Review No results found. I have personally reviewed and evaluated these images and lab results as part of my medical decision-making.   EKG Interpretation None      MDM   Final diagnoses:  Nonintractable headache, unspecified chronicity pattern, unspecified headache type  Mallory-Weiss tear    28yM with headache and n/v. Symptoms currently resolved. Benign exam. Suspect small mallory weiss tear. Doubt significant GIB. It has been determined that no  acute conditions requiring further emergency intervention are present at this time. The patient has been advised of the diagnosis and plan. I reviewed any labs and imaging including any potential incidental findings. We have discussed signs and symptoms that warrant return to the ED and they are listed in the discharge instructions.      Bobby Razor, MD 01/08/15 [phone]

## 2015-04-30 ENCOUNTER — Emergency Department (HOSPITAL_COMMUNITY)
Admission: EM | Admit: 2015-04-30 | Discharge: 2015-04-30 | Disposition: A | Discharge status: Home / Self Care | Payer: Self-pay | Attending: Emergency Medicine | Admitting: Emergency Medicine

## 2015-04-30 ENCOUNTER — Encounter (HOSPITAL_COMMUNITY): Payer: Self-pay | Admitting: Emergency Medicine

## 2015-04-30 ENCOUNTER — Emergency Department (HOSPITAL_COMMUNITY): Payer: Self-pay

## 2015-04-30 DIAGNOSIS — Z79899 Other long term (current) drug therapy: Secondary | ICD-10-CM | POA: Insufficient documentation

## 2015-04-30 DIAGNOSIS — F17210 Nicotine dependence, cigarettes, uncomplicated: Secondary | ICD-10-CM | POA: Insufficient documentation

## 2015-04-30 DIAGNOSIS — M79672 Pain in left foot: Secondary | ICD-10-CM | POA: Insufficient documentation

## 2015-04-30 MED ORDER — KETOROLAC TROMETHAMINE 60 MG/2ML IM SOLN
60.0000 mg | Freq: Once | INTRAMUSCULAR | Status: AC
  Administered 2015-04-30: 60 mg via INTRAMUSCULAR
  Filled 2015-04-30: qty 2

## 2015-04-30 MED ORDER — IBUPROFEN 600 MG PO TABS: 600.0000 mg | ORAL_TABLET | Freq: Four times a day (QID) | ORAL | Status: DC | PRN

## 2015-04-30 NOTE — ED Provider Notes (Signed)
CSN: [id number]     Arrival date & time 04/30/15  4098 History   First MD Initiated Contact with Patient 04/30/15 0804     Chief Complaint  Patient presents with  . Foot Pain   (Consider location/radiation/quality/duration/timing/severity/associated sxs/prior Treatment) Patient is a 29 y.o. male presenting with lower extremity pain. The history is provided by the patient. No language interpreter was used.  Foot Pain Associated symptoms include arthralgias. Pertinent negatives include no numbness.   Mr. Tulloch is a 29 y.o male with no past medical history who presents for gradual onset of left foot pain that began yesterday while at work. Worse with ambulation. He took a hydrocodone that he got from a friend last night for his pain which did not help much. He states he has had this a couple times in the past and that it has resolved on its own after a couple of days. He also states that it started after he got a wire stuck in his foot years ago. He also mentioned that he spoke to a friend of his that is a Engineer, civil (consulting) and told him that it might possibly be gout. He smokes half a pack a day. Past Medical History  Diagnosis Date  . Seasonal allergies    History reviewed. No pertinent past surgical history. No family history on file. Social History  Substance Use Topics  . Smoking status: Current Every Day Smoker -- 0.50 packs/day    Types: Cigarettes  . Smokeless tobacco: Former Neurosurgeon  . Alcohol Use: Yes     Comment: once weekly    Review of Systems  Musculoskeletal: Positive for arthralgias.  Skin: Negative for wound.  Neurological: Negative for numbness.      Allergies  Codeine  Home Medications   Prior to Admission medications   Medication Sig Start Date End Date Taking? Authorizing Provider  acetaminophen (TYLENOL) 500 MG tablet Take 1,000 mg by mouth 2 (two) times daily as needed (pain).     Historical Provider, MD  ibuprofen (ADVIL,MOTRIN) 600 MG tablet Take 1 tablet (600 mg  total) by mouth every 6 (six) hours as needed. 04/30/15   Kenslee Achorn Patel-Mills, PA-C  omeprazole (PRILOSEC) 20 MG capsule Take 1 capsule (20 mg total) by mouth daily. 04/12/13   Jennifer Piepenbrink, PA-C   BP 146/85 mmHg  Pulse 95  Temp(Src) 97.9 F (36.6 C) (Oral)  Resp 18  Ht  (1.702 m)  Wt 86.183 kg  BMI 29.75 kg/m2  SpO2 97% Physical Exam  Constitutional: He is oriented to person, place, and time. He appears well-developed and well-nourished. No distress.  HENT:  Head: Normocephalic and atraumatic.  Eyes: Conjunctivae are normal.  Neck: Neck supple.  Cardiovascular: Normal rate.   Pulmonary/Chest: Effort normal. No respiratory distress.  Musculoskeletal:  Ambulatory with limping gait favoring right foot. Tenderness along the fifth metatarsal but no deformity. Able to flex and extend toes without difficulty. No calcaneus, navicular, or ankle pain. No erythema, edema, or ecchymosis.  Neurological: He is alert and oriented to person, place, and time.  Skin: Skin is warm and dry.  Nursing note and vitals reviewed.   ED Course  Procedures (including critical care time) Labs Review Labs Reviewed - No data to display  Imaging Review Dg Foot Complete Left  04/30/2015  CLINICAL DATA:  Possible metal wire 3 years ago, distal fifth metatarsal pain, foot pain EXAM: LEFT FOOT - COMPLETE 3+ VIEW COMPARISON:  09/08/2008 FINDINGS: Three views of the left foot submitted. No acute fracture  or subluxation. No radiopaque foreign body. No periosteal reaction or bony erosion. IMPRESSION: Negative. Electronically Signed   By: Natasha MeadLiviu  Pop M.D.   On: 04/30/2015 08:46   I have personally reviewed and evaluated these image results as part of my medical decision-making.   EKG Interpretation None      MDM   Final diagnoses:  Foot pain, left   Patient presented for left foot pain along the fifth metatarsal. There is no tenderness to light touch, swelling, or erythema to the foot. No  deformity. No concerns for gout. Patient is able to ambulate well with a limping gait. His x-ray was negative for bone spur, fracture, or bony erosion. I discussed findings with the patient. He was given orthopedic referral. I also discussed RICE. He was told to take ibuprofen for pain. Patient agrees with the plan.    Catha GosselinHanna Patel-Mills, PA-C 04/30/15 0901  Rolan BuccoMelanie Belfi, MD 04/30/15 1350

## 2015-04-30 NOTE — ED Notes (Signed)
Patient states L foot pain x 2 days.   Patient denies accident or injury.  No redness or swelling noted.   Patient states "my friend is a Engineer, civil (consulting)nurse and she said that it might be gout".

## 2015-04-30 NOTE — Discharge Instructions (Signed)
Pain Without a Known Cause Follow-up with orthopedics. Take ibuprofen for pain. Rest. Ice. Elevate. WHAT IS PAIN WITHOUT A KNOWN CAUSE? Pain can occur in any part of the body and can range from mild to severe. Sometimes no cause can be found for why you are having pain. Some types of pain that can occur without a known cause include:   Headache.  Back pain.  Abdominal pain.  Neck pain. HOW IS PAIN WITHOUT A KNOWN CAUSE DIAGNOSED?  Your health care provider will try to find the cause of your pain. This may include:  Physical exam.  Medical history.  Blood tests.  Urine tests.  X-rays. If no cause is found, your health care provider may diagnose you with pain without a known cause.  IS THERE TREATMENT FOR PAIN WITHOUT A CAUSE?  Treatment depends on the kind of pain you have. Your health care provider may prescribe medicines to help relieve your pain.  WHAT CAN I DO AT HOME FOR MY PAIN?   Take medicines only as directed by your health care provider.  Stop any activities that cause pain. During periods of severe pain, bed rest may help.  Try to reduce your stress with activities such as yoga or meditation. Talk to your health care provider for other stress-reducing activity recommendations.  Exercise regularly, if approved by your health care provider.  Eat a healthy diet that includes fruits and vegetables. This may improve pain. Talk to your health care provider if you have any questions about your diet. WHAT IF MY PAIN DOES NOT GET BETTER?  If you have a painful condition and no reason can be found for the pain or the pain gets worse, it is important to follow up with your health care provider. It may be necessary to repeat tests and look further for a possible cause.    This information is not intended to replace advice given to you by your health care provider. Make sure you discuss any questions you have with your health care provider.   Document Released: 01/18/2001  Document Revised: 05/16/2014 Document Reviewed: 09/10/2013 Elsevier Interactive Patient Education [employer]2016 Elsevier Inc.

## 2016-09-14 ENCOUNTER — Emergency Department (HOSPITAL_COMMUNITY)
Admission: EM | Admit: 2016-09-14 | Discharge: 2016-09-14 | Disposition: A | Discharge status: Home / Self Care | Payer: Self-pay | Attending: Emergency Medicine | Admitting: Emergency Medicine

## 2016-09-14 ENCOUNTER — Encounter (HOSPITAL_COMMUNITY): Payer: Self-pay | Admitting: Nurse Practitioner

## 2016-09-14 DIAGNOSIS — S76991A Other specified injury of unspecified muscles, fascia and tendons at thigh level, right thigh, initial encounter: Secondary | ICD-10-CM | POA: Insufficient documentation

## 2016-09-14 DIAGNOSIS — F17210 Nicotine dependence, cigarettes, uncomplicated: Secondary | ICD-10-CM | POA: Insufficient documentation

## 2016-09-14 DIAGNOSIS — Y999 Unspecified external cause status: Secondary | ICD-10-CM | POA: Insufficient documentation

## 2016-09-14 DIAGNOSIS — Y939 Activity, unspecified: Secondary | ICD-10-CM | POA: Insufficient documentation

## 2016-09-14 DIAGNOSIS — Y929 Unspecified place or not applicable: Secondary | ICD-10-CM | POA: Insufficient documentation

## 2016-09-14 DIAGNOSIS — Z79899 Other long term (current) drug therapy: Secondary | ICD-10-CM | POA: Insufficient documentation

## 2016-09-14 DIAGNOSIS — S76309A Unspecified injury of muscle, fascia and tendon of the posterior muscle group at thigh level, unspecified thigh, initial encounter: Secondary | ICD-10-CM

## 2016-09-14 DIAGNOSIS — X500XXA Overexertion from strenuous movement or load, initial encounter: Secondary | ICD-10-CM | POA: Insufficient documentation

## 2016-09-14 MED ORDER — IBUPROFEN 800 MG PO TABS: 800.0000 mg | ORAL_TABLET | Freq: Three times a day (TID) | ORAL | 0 refills | Status: AC | PRN

## 2016-09-14 MED ORDER — TRAMADOL HCL 50 MG PO TABS: 50.0000 mg | ORAL_TABLET | Freq: Four times a day (QID) | ORAL | 0 refills | Status: AC | PRN

## 2016-09-14 NOTE — Discharge Instructions (Signed)
Return here as needed.  Use ice and heat on the area that is sore

## 2016-09-14 NOTE — ED Triage Notes (Signed)
Pt presents with c/o R thigh pain. The pain began yesterday after lifting furniture. The pain has been increasingly worse since onset. He has tried ice, bengay, advil with no relief

## 2016-09-14 NOTE — ED Provider Notes (Signed)
[facility]-EMERGENCY DEPT Provider Note   CSN: [id number] Arrival date & time: 09/14/16  1813   By signing my name below, I, Teofilo Pod, attest that this documentation has been prepared under the direction and in the presence of [employer], PA-C. Electronically Signed: Teofilo Pod, ED Scribe. 09/14/2016. 7:51 PM.   History   Chief Complaint Chief Complaint  Patient presents with  . Leg Pain    The history is provided by the patient. No language interpreter was used.   HPI Comments:  Bobby Barrett is a 31 y.o. male who presents to the Emergency Department complaining of constant right thigh pain x 2 days. Pt reports that when he came home from work 2 days ago he suddenly felt pain in his right thigh near the knee. Yesterday at work, the pain worsened after lifting heavy furniture. He states that the pain radiates to this right hip. Pt has used ice, Bengay, and Advil with no relief. Pt denies other associated symptoms.   Past Medical History:  Diagnosis Date  . Seasonal allergies     There are no active problems to display for this patient.   History reviewed. No pertinent surgical history.     Home Medications    Prior to Admission medications   Medication Sig Start Date End Date Taking? Authorizing Provider  acetaminophen (TYLENOL) 500 MG tablet Take 1,000 mg by mouth 2 (two) times daily as needed (pain).     [provider]  ibuprofen (ADVIL,MOTRIN) 600 MG tablet Take 1 tablet (600 mg total) by mouth every 6 (six) hours as needed. 04/30/15   Patel-Mills, Lorelle Formosa, PA-C  omeprazole (PRILOSEC) 20 MG capsule Take 1 capsule (20 mg total) by mouth daily. 04/12/13   PiepenbrinkVictorino Dike, PA-C    Family History History reviewed. No pertinent family history.  Social History Social History  Substance Use Topics  . Smoking status: Current Every Day Smoker    Packs/day: 0.50    Types: Cigarettes  . Smokeless tobacco: Former Neurosurgeon  . Alcohol use Yes   Comment: once weekly     Allergies   Codeine   Review of Systems Review of Systems All other systems negative except as documented in the HPI. All pertinent positives and negatives as reviewed in the HPI.   Physical Exam Updated Vital Signs BP 107/88 (BP Location: Left Arm)   Pulse (!) 123   Temp 98.4 F (36.9 C) (Oral)   Resp 16   SpO2 98%   Physical Exam  Constitutional: He appears well-developed and well-nourished. No distress.  HENT:  Head: Normocephalic and atraumatic.  Eyes: Conjunctivae are normal.  Cardiovascular: Normal rate.   Pulmonary/Chest: Effort normal.  Abdominal: He exhibits no distension.  Musculoskeletal:  Tender over hamstring insertion muscle group. No swelling, redness, or knee pain. Full ROM but with discomfort. No calf tenderness.   Neurological: He is alert.  Skin: Skin is warm and dry.  Psychiatric: He has a normal mood and affect.  Nursing note and vitals reviewed.    ED Treatments / Results  DIAGNOSTIC STUDIES:  Oxygen Saturation is 98% on RA, normal by my interpretation.    COORDINATION OF CARE:  7:49 PM Discussed treatment plan with pt at bedside and pt agreed to plan.   Labs (all labs ordered are listed, but only abnormal results are displayed) Labs Reviewed - No data to display  EKG  EKG Interpretation None       Radiology No results found.  Procedures Procedures (including critical  care time)  Medications Ordered in ED Medications - No data to display   Initial Impression / Assessment and Plan / ED Course  I have reviewed the triage vital signs and the nursing notes.  Pertinent labs & imaging results that were available during my care of the patient were reviewed by me and considered in my medical decision making (see chart for details).     Patient most likely injured his hamstring muscle while lifting heavy furniture.  Patient has no signs of DVT.  The areas very specific to the insertion of the hamstring  muscles.  Patient has no foreign patellar tendon or quadriceps tendon.  Patient is referred to orthopedics.  He also had the area wrapped with Ace wraps as well.  Patient is advised plan and all questions were answered  Final Clinical Impressions(s) / ED Diagnoses   Final diagnoses:  None    New Prescriptions New Prescriptions   No medications on file  I personally performed the services described in this documentation, which was scribed in my presence. The recorded information has been reviewed and is accurate.    Charlestine NightLawyer, Joshoa Shawler, PA-C 09/16/16 [id number]    Gerhard MunchLockwood, Robert, MD 09/17/16 1745

## 2018-03-21 ENCOUNTER — Encounter (HOSPITAL_COMMUNITY): Payer: Self-pay | Admitting: Emergency Medicine

## 2018-03-21 ENCOUNTER — Emergency Department (HOSPITAL_COMMUNITY): Payer: Self-pay

## 2018-03-21 ENCOUNTER — Emergency Department (HOSPITAL_COMMUNITY)
Admission: EM | Admit: 2018-03-21 | Discharge: 2018-03-21 | Disposition: A | Discharge status: Home / Self Care | Payer: Self-pay | Attending: Emergency Medicine | Admitting: Emergency Medicine

## 2018-03-21 DIAGNOSIS — W19XXXA Unspecified fall, initial encounter: Secondary | ICD-10-CM | POA: Insufficient documentation

## 2018-03-21 DIAGNOSIS — Z79899 Other long term (current) drug therapy: Secondary | ICD-10-CM | POA: Insufficient documentation

## 2018-03-21 DIAGNOSIS — F17210 Nicotine dependence, cigarettes, uncomplicated: Secondary | ICD-10-CM | POA: Insufficient documentation

## 2018-03-21 DIAGNOSIS — Y939 Activity, unspecified: Secondary | ICD-10-CM | POA: Insufficient documentation

## 2018-03-21 DIAGNOSIS — Y999 Unspecified external cause status: Secondary | ICD-10-CM | POA: Insufficient documentation

## 2018-03-21 DIAGNOSIS — S29011A Strain of muscle and tendon of front wall of thorax, initial encounter: Secondary | ICD-10-CM | POA: Insufficient documentation

## 2018-03-21 DIAGNOSIS — Y929 Unspecified place or not applicable: Secondary | ICD-10-CM | POA: Insufficient documentation

## 2018-03-21 MED ORDER — NAPROXEN 500 MG PO TABS: 500.0000 mg | ORAL_TABLET | Freq: Two times a day (BID) | ORAL | 0 refills | Status: AC

## 2018-03-21 NOTE — ED Triage Notes (Signed)
Pt with left sided rib pain after a fall. Bruising noted. Denies LOC. A/o x4

## 2018-03-21 NOTE — Discharge Instructions (Addendum)
Return if you develop shortness of breath.  If the pain moves and goes to your abdomen or arm.  Or if you develop fever, vomiting or other concerns.  Avoid heavy lifting so muscle can heal.

## 2018-03-21 NOTE — ED Provider Notes (Signed)
[facility] Provider Note   CSN: [id number] Arrival date & time: 03/21/18  1718     History   Chief Complaint No chief complaint on file.   HPI Bobby Barrett is a 32 y.o. male.  The history is provided by the patient.  Chest Pain   This is a new problem. Episode onset: 5 days ago. The problem occurs constantly. The problem has not changed since onset.The pain is associated with lifting, raising an arm, breathing, coughing and movement. The pain is present in the lateral region. The pain is at a severity of 7/10. The pain is moderate. The quality of the pain is described as pleuritic, sharp and stabbing. The pain does not radiate. Duration of episode(s) is 5 days. The symptoms are aggravated by certain positions and deep breathing. Pertinent negatives include no abdominal pain, no back pain, no cough, no diaphoresis, no dizziness, no exertional chest pressure, no fever, no nausea, no palpitations, no shortness of breath, no sputum production, no syncope, no vomiting and no weakness. He has tried rest (muscle rub) for the symptoms. The treatment provided mild relief. Risk factors include smoking/tobacco exposure (drinks alcohol regularly).  Pertinent negatives for past medical history include no CAD.    Past Medical History:  Diagnosis Date  . Seasonal allergies     There are no active problems to display for this patient.   No past surgical history on file.      Home Medications    Prior to Admission medications   Medication Sig Start Date End Date Taking? Authorizing Provider  acetaminophen (TYLENOL) 500 MG tablet Take 1,000 mg by mouth 2 (two) times daily as needed (pain).     [provider]  ibuprofen (ADVIL,MOTRIN) 800 MG tablet Take 1 tablet (800 mg total) by mouth every 8 (eight) hours as needed. 09/14/16   Lawyer, Cristal Deerhristopher, PA-C  omeprazole (PRILOSEC) 20 MG capsule Take 1 capsule (20 mg total) by mouth daily. 04/12/13    Piepenbrink, Victorino DikeJennifer, PA-C  traMADol (ULTRAM) 50 MG tablet Take 1 tablet (50 mg total) by mouth every 6 (six) hours as needed for severe pain. 09/14/16   Lawyer, Cristal Deerhristopher, PA-C    Family History No family history on file.  Social History Social History   Tobacco Use  . Smoking status: Current Every Day Smoker    Packs/day: 0.50    Types: Cigarettes  . Smokeless tobacco: Former Engineer, waterUser  Substance Use Topics  . Alcohol use: Yes    Comment: once weekly  . Drug use: No     Allergies   Codeine   Review of Systems Review of Systems  Constitutional: Negative for diaphoresis and fever.  Respiratory: Negative for cough, sputum production and shortness of breath.   Cardiovascular: Positive for chest pain. Negative for palpitations and syncope.  Gastrointestinal: Negative for abdominal pain, nausea and vomiting.  Musculoskeletal: Negative for back pain.  Neurological: Negative for dizziness and weakness.  All other systems reviewed and are negative.    Physical Exam Updated Vital Signs BP (!) 156/108 (BP Location: Right Arm)   Pulse 93   Temp 97.8 F (36.6 C) (Oral)   Resp 20   SpO2 100%   Physical Exam  Constitutional: He is oriented to person, place, and time. He appears well-developed and well-nourished. No distress.  HENT:  Head: Normocephalic and atraumatic.  Mouth/Throat: Oropharynx is clear and moist.  Eyes: Pupils are equal, round, and reactive to light. Conjunctivae and EOM are normal.  Neck: Normal range of motion. Neck supple.  Cardiovascular: Normal rate, regular rhythm and intact distal pulses.  No murmur heard. Pulmonary/Chest: Effort normal and breath sounds normal. No respiratory distress. He has no wheezes. He has no rales. He exhibits tenderness. He exhibits no mass, no crepitus, no edema, no swelling and no retraction.    Abdominal: Soft. He exhibits no distension. There is no tenderness. There is no rebound and no guarding.  Musculoskeletal: Normal  range of motion. He exhibits no edema or tenderness.  Neurological: He is alert and oriented to person, place, and time.  Skin: Skin is warm and dry. No rash noted. No erythema.  Psychiatric: He has a normal mood and affect. His behavior is normal.  Nursing note and vitals reviewed.    ED Treatments / Results  Labs (all labs ordered are listed, but only abnormal results are displayed) Labs Reviewed - No data to display  EKG None  Radiology No results found.  Procedures Procedures (including critical care time)  Medications Ordered in ED Medications - No data to display   Initial Impression / Assessment and Plan / ED Course  I have reviewed the triage vital signs and the nursing notes.  Pertinent labs & imaging results that were available during my care of the patient were reviewed by me and considered in my medical decision making (see chart for details).  Clinical Course as of Mar 27 1504  Wed Mar 21, 2018  1929 Patient resting comfortably in bed using phone, no acute distress.  Patient requesting discharge.  Patient updated on x-ray findings and plan of care at this time.  Patient is agreeable to discharge with anti-inflammatories and PCP follow-up.   [BM]    Clinical Course User Index [BM] [name], PA-C    Patient is a healthy 33 year old male with no significant past medical history except for daily tobacco use and intermittent heavy alcohol use who presents today with 5 days of worsening pain in his left chest.  The pain is pleuritic in nature and worse with moving lifting.  Patient denies any shortness of breath.  Symptoms do not seem like ACS based on history and exam.  He has no abdominal symptoms.  Hypertensive here but otherwise vital signs are reassuring.  Concern for possible rib fracture.  Patient states his [birth year] was on [birth year] and he was drinking heavily that night and then woke up Friday with the symptoms.  Concerned that he may have fallen and  did not remember it.  Chest x-ray pending.  X-ray pending and pt most likely has muscular strain Final Clinical Impressions(s) / ED Diagnoses   Final diagnoses:  Chest wall muscle strain, initial encounter    ED Discharge Orders    None       Gwyneth Sprout, MD 03/27/18 1506

## 2018-03-21 NOTE — ED Notes (Signed)
Patient transported to X-ray 

## 2018-03-21 NOTE — ED Provider Notes (Signed)
Patient care handoff received from Dr. Anitra Lauth at shift change.  Please see her note for full details.  In short 32 year old male with possible fall occurring 5 days ago while intoxicated, left-sided rib pain.  Plan from Dr. Anitra Lauth is to to discharge pending x-ray results with naproxen. Physical Exam  BP (!) 156/108 (BP Location: Right Arm)   Pulse 93   Temp 97.8 F (36.6 C) (Oral)   Resp 20   SpO2 100%   Physical Exam  Constitutional: He is oriented to person, place, and time. He appears well-developed and well-nourished. No distress.  HENT:  Head: Normocephalic and atraumatic.  Right Ear: External ear normal.  Left Ear: External ear normal.  Nose: Nose normal.  Eyes: Pupils are equal, round, and reactive to light. EOM are normal.  Neck: Trachea normal and normal range of motion. No tracheal deviation present.  Cardiovascular: Normal rate and regular rhythm.  Pulses:      Dorsalis pedis pulses are 2+ on the right side, and 2+ on the left side.       Posterior tibial pulses are 2+ on the right side, and 2+ on the left side.  Pulmonary/Chest: Effort normal and breath sounds normal. No respiratory distress. He has no decreased breath sounds. He exhibits tenderness. He exhibits no crepitus, no edema, no deformity and no swelling.  Patient tender to light palpation of the left ribs. Left rib pain worsened with movement of the upper extremities.  Faint bruise noted to the area.    Abdominal: Soft. There is no tenderness. There is no rebound and no guarding.  Musculoskeletal: Normal range of motion.       Right shoulder: Normal.       Left shoulder: Normal.       Right knee: Normal.       Left knee: Normal.       Right ankle: Normal.       Left ankle: Normal.       Right lower leg: Normal.       Left lower leg: Normal.  Neurological: He is alert and oriented to person, place, and time.  Skin: Skin is warm and dry. Capillary refill takes less than 2 seconds.  Psychiatric: He has  a normal mood and affect. His behavior is normal.    ED Course/Procedures   Clinical Course as of Mar 21 1928  Wed Mar 21, 2018  1929 Patient resting comfortably in bed using phone, no acute distress.  Patient requesting discharge.  Patient updated on x-ray findings and plan of care at this time.  Patient is agreeable to discharge with anti-inflammatories and PCP follow-up.   [BM]    Clinical Course User Index [BM] Elizabeth Palau    Procedures  MDM  32 year old otherwise healthy male presenting today for 5 days of left rib pain after possible fall 5 days ago.  Tenderness to light palpation of the left rib musculature and with movement of the arms.  Possible faint bruise noted overlying the area.  Patient without shortness of breath or radiation of pain.  Patient denies pain if he is not moving or touching the area.  Imaging of ribs/chest today without acute finding.  Per Dr. Loralee Pacas care plan at shift change, patient without acute finding on x-ray, has been prescribed naproxen by Dr. Anitra Lauth and will be discharged with PCP follow-up.  Patient denies history of kidney disease or gastric bleeding.  Patient has been given a work note and informed to avoid heavy  lifting to help healing of his muscular strain.  Patient is afebrile, not tachycardic, not hypotensive, well-appearing and in no acute distress.  Shared decision making made with patient, he wishes to be discharged at this time with anti-inflammatories and heavy lifting restrictions, states that he will follow-up with his primary care provider within 1 week.  Patient also informed of elevated blood pressure reading and need for primary care follow-up/recheck regarding this as well.  Patient informed of the signs and symptoms of hypertensive urgency/emergency and to return to the emergency department if he develops these symptoms.  At this time there does not appear to be any evidence of an acute emergency medical condition  and the patient appears stable for discharge with appropriate outpatient follow up. Diagnosis was discussed with patient who verbalizes understanding of care plan and is agreeable to discharge. I have discussed return precautions with patient who verbalizes understanding of return precautions. Patient strongly encouraged to follow-up with their PCP within one week. All questions answered.  Patient's case discussed with Dr. Anitra LauthPlunkett who agrees with plan to discharge with naproxen and follow-up.   Note: Portions of this report may have been transcribed using voice recognition software. Every effort was made to ensure accuracy; however, inadvertent computerized transcription errors may still be present.   Bill SalinasMorelli, Siddharth Babington A, PA-C 03/21/18 1948    Melene PlanFloyd, Dan, DO 03/21/18 2310

## 2019-04-08 ENCOUNTER — Other Ambulatory Visit: Payer: Self-pay

## 2019-04-08 DIAGNOSIS — Z20822 Contact with and (suspected) exposure to covid-19: Secondary | ICD-10-CM

## 2019-04-09 LAB — NOVEL CORONAVIRUS, NAA: SARS-CoV-2, NAA: NOT DETECTED

## 2019-05-15 ENCOUNTER — Ambulatory Visit: Payer: HRSA Program | Attending: Internal Medicine

## 2019-05-15 DIAGNOSIS — Z20822 Contact with and (suspected) exposure to covid-19: Secondary | ICD-10-CM | POA: Insufficient documentation

## 2019-05-17 LAB — NOVEL CORONAVIRUS, NAA: SARS-CoV-2, NAA: NOT DETECTED

## 2019-06-04 ENCOUNTER — Other Ambulatory Visit: Payer: Self-pay

## 2019-06-05 ENCOUNTER — Other Ambulatory Visit: Payer: Self-pay

## 2020-05-24 IMAGING — DX DG RIBS W/ CHEST 3+V*L*
3 series · 3 of 3 positions shown · non-contrast
Comparison: Prior chest x-ray 04/12/2013

CLINICAL DATA: 32-year-old male with left lower rib pain

EXAM:
LEFT RIBS AND CHEST - 3+ VIEW

[w chest pa]
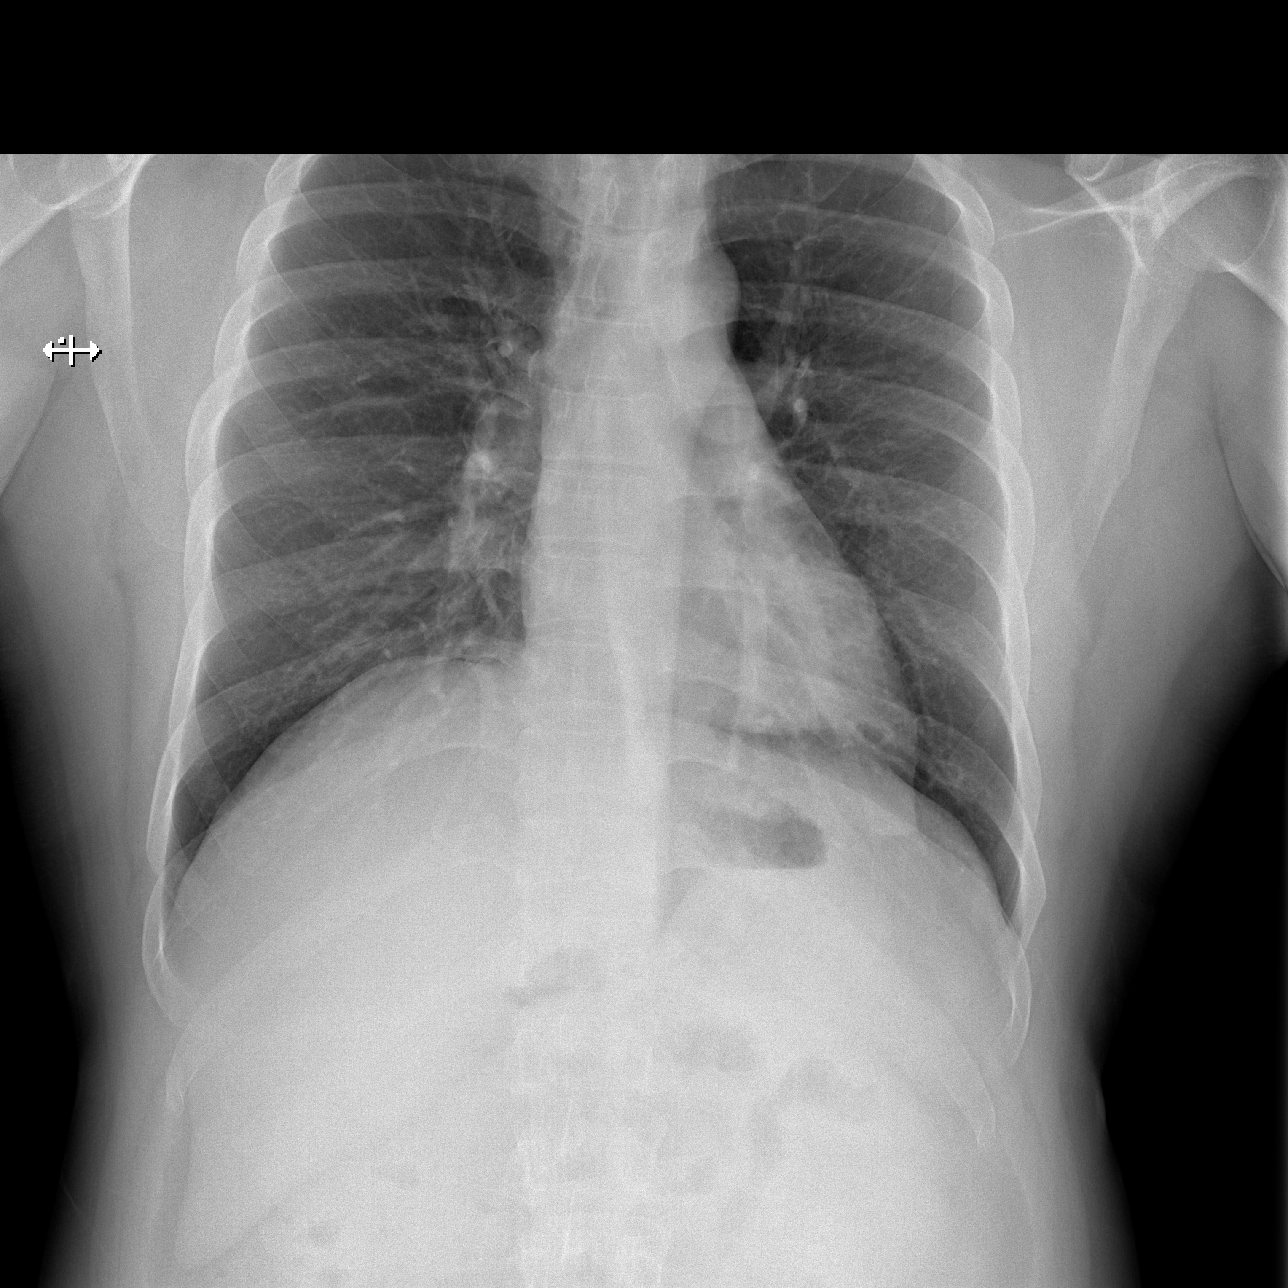

[w ribs ap upper left]
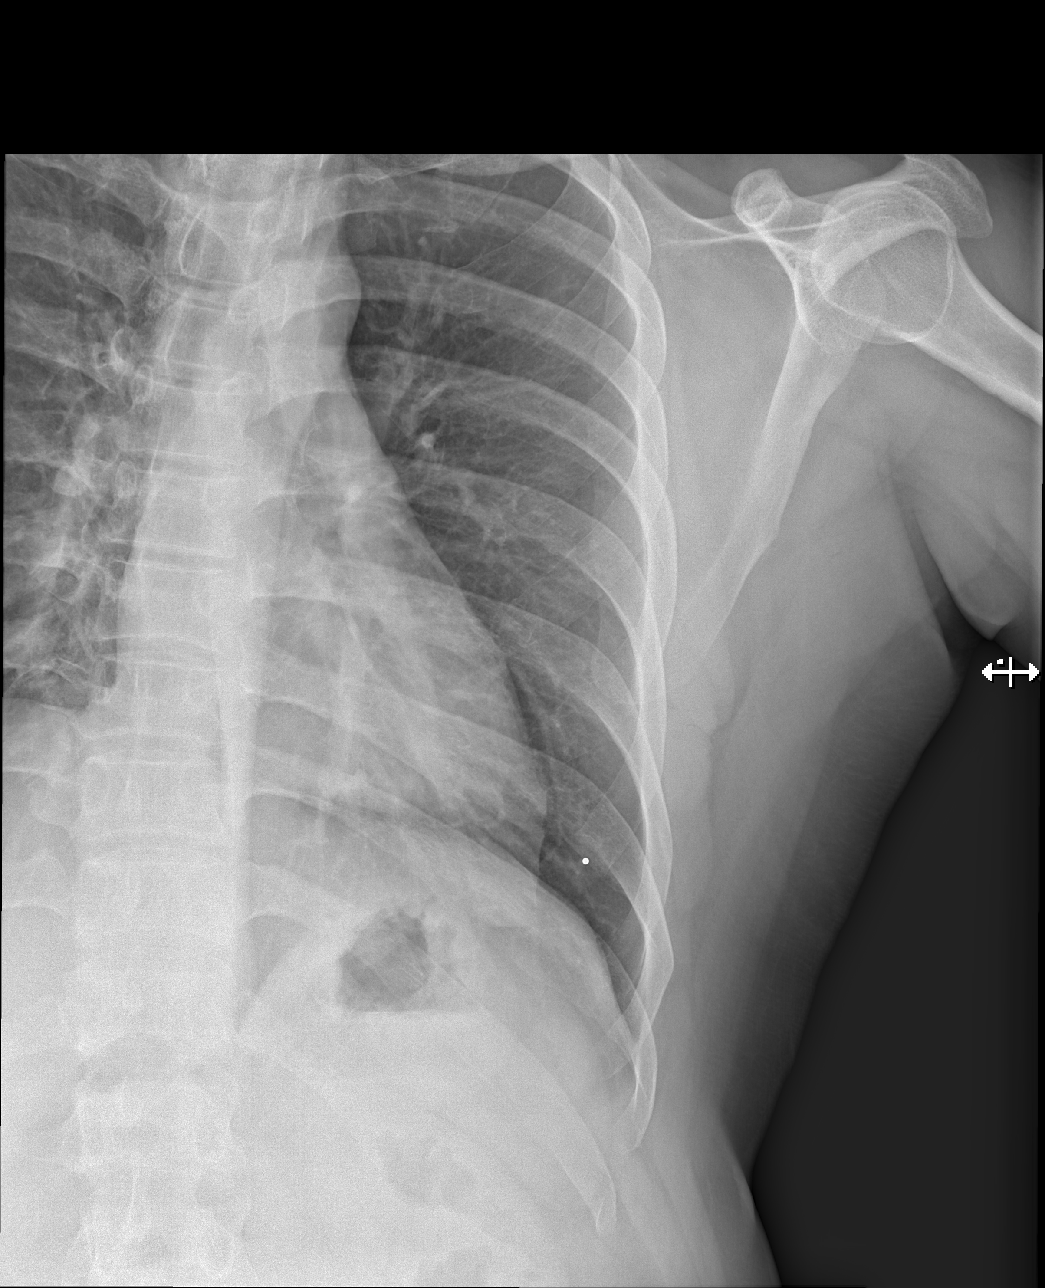

[w ribs obl left]
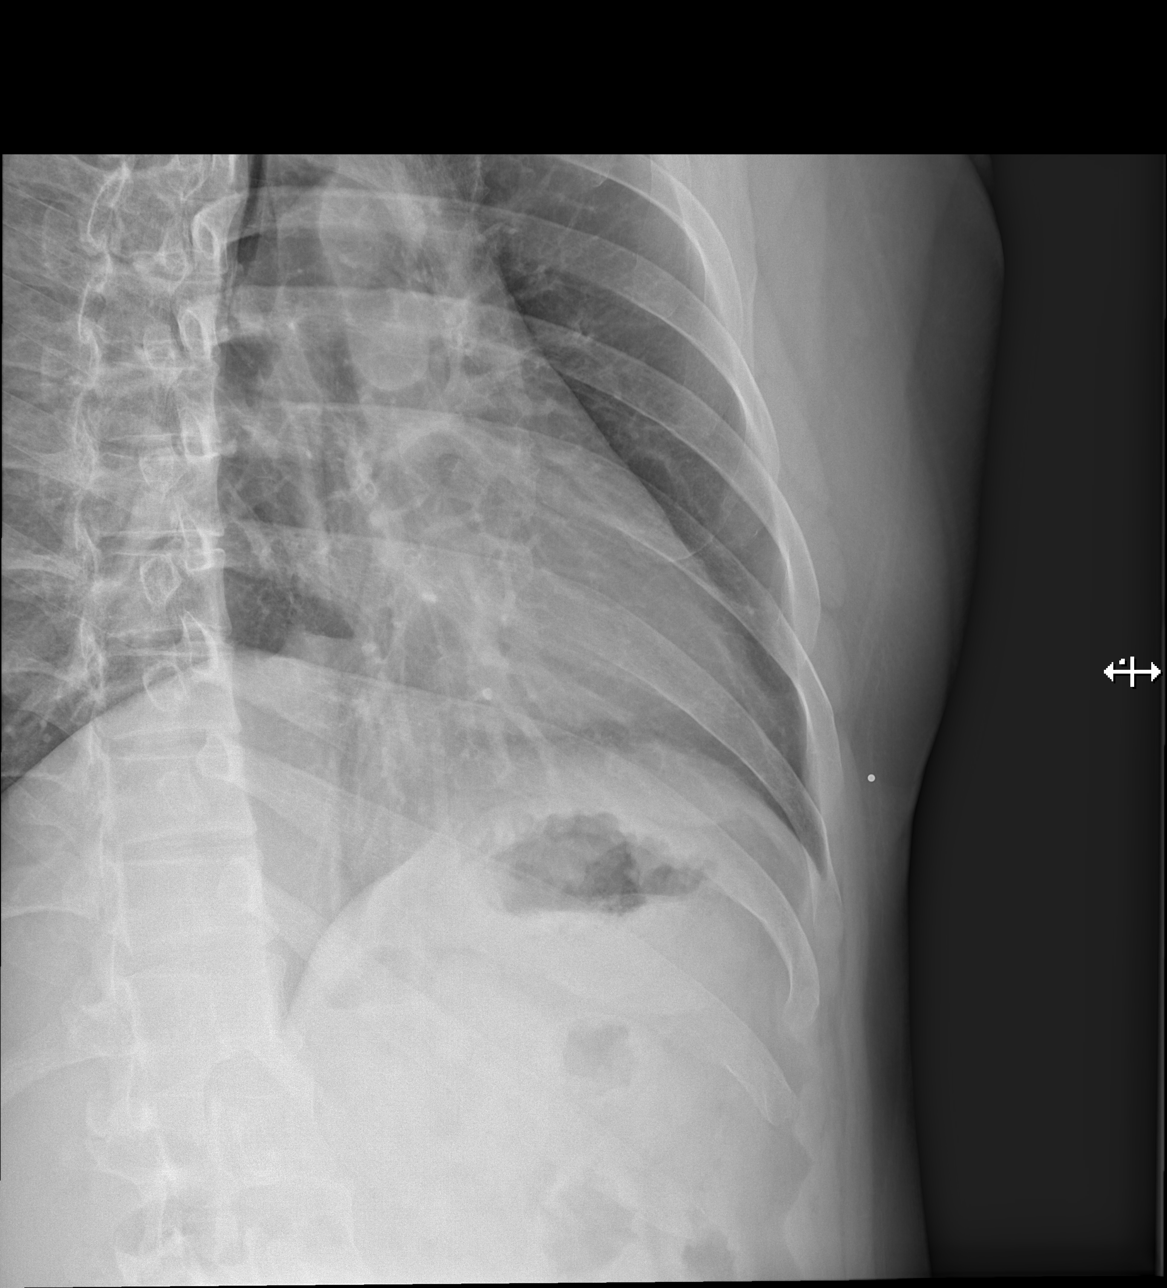

[3 of 3 positions shown; findings below may reference images not displayed]

FINDINGS: No fracture or other bone lesions are seen involving the ribs. There
is no evidence of pneumothorax or pleural effusion. Both lungs are
clear. Heart size and mediastinal contours are within normal limits.
IMPRESSION: Negative.
